# Patient Record
Sex: Female | Born: 1955
Health system: Southern US, Community
[De-identification: ages and names within clinical notes are randomized; demographics above are authoritative.]

## PROBLEM LIST (undated history)

## (undated) DIAGNOSIS — Z8719 Personal history of other diseases of the digestive system: Secondary | ICD-10-CM

## (undated) DIAGNOSIS — J449 Chronic obstructive pulmonary disease, unspecified: Secondary | ICD-10-CM

## (undated) DIAGNOSIS — R0600 Dyspnea, unspecified: Secondary | ICD-10-CM

## (undated) DIAGNOSIS — M797 Fibromyalgia: Secondary | ICD-10-CM

## (undated) DIAGNOSIS — F419 Anxiety disorder, unspecified: Secondary | ICD-10-CM

## (undated) DIAGNOSIS — F32A Depression, unspecified: Secondary | ICD-10-CM

## (undated) DIAGNOSIS — R7303 Prediabetes: Secondary | ICD-10-CM

## (undated) DIAGNOSIS — R112 Nausea with vomiting, unspecified: Secondary | ICD-10-CM

## (undated) DIAGNOSIS — D649 Anemia, unspecified: Secondary | ICD-10-CM

## (undated) DIAGNOSIS — I96 Gangrene, not elsewhere classified: Secondary | ICD-10-CM

## (undated) DIAGNOSIS — R519 Headache, unspecified: Secondary | ICD-10-CM

## (undated) DIAGNOSIS — K219 Gastro-esophageal reflux disease without esophagitis: Secondary | ICD-10-CM

## (undated) DIAGNOSIS — Z9889 Other specified postprocedural states: Secondary | ICD-10-CM

## (undated) DIAGNOSIS — E119 Type 2 diabetes mellitus without complications: Secondary | ICD-10-CM

## (undated) DIAGNOSIS — E039 Hypothyroidism, unspecified: Secondary | ICD-10-CM

## (undated) DIAGNOSIS — M1990 Unspecified osteoarthritis, unspecified site: Secondary | ICD-10-CM

## (undated) DIAGNOSIS — J189 Pneumonia, unspecified organism: Secondary | ICD-10-CM

## (undated) DIAGNOSIS — F329 Major depressive disorder, single episode, unspecified: Secondary | ICD-10-CM

## (undated) DIAGNOSIS — I10 Essential (primary) hypertension: Secondary | ICD-10-CM

## (undated) HISTORY — PX: EYE SURGERY: SHX253

## (undated) HISTORY — PX: BACK SURGERY: SHX140

## (undated) HISTORY — PX: TUBAL LIGATION: SHX77

## (undated) HISTORY — PX: ABDOMINAL HYSTERECTOMY: SHX81

## (undated) HISTORY — PX: DIAGNOSTIC LAPAROSCOPY: SUR761

## (undated) HISTORY — PX: CHOLECYSTECTOMY: SHX55

## (undated) HISTORY — PX: VENTRAL HERNIA REPAIR: SHX424

---

## 2003-02-06 ENCOUNTER — Inpatient Hospital Stay (HOSPITAL_COMMUNITY): Admission: AC | Admit: 2003-02-06 | Discharge: 2003-02-19 | Payer: Self-pay

## 2003-02-07 ENCOUNTER — Encounter: Payer: Self-pay | Admitting: General Surgery

## 2003-02-09 ENCOUNTER — Encounter: Payer: Self-pay | Admitting: General Surgery

## 2003-04-21 ENCOUNTER — Encounter: Admission: RE | Admit: 2003-04-21 | Discharge: 2003-07-20 | Payer: Self-pay | Admitting: General Surgery

## 2003-10-26 ENCOUNTER — Inpatient Hospital Stay (HOSPITAL_COMMUNITY): Admission: RE | Admit: 2003-10-26 | Discharge: 2003-10-30 | Payer: Self-pay | Admitting: General Surgery

## 2003-11-02 ENCOUNTER — Inpatient Hospital Stay (HOSPITAL_COMMUNITY): Admission: RE | Admit: 2003-11-02 | Discharge: 2003-11-04 | Payer: Self-pay | Admitting: General Surgery

## 2010-10-04 DIAGNOSIS — R7301 Impaired fasting glucose: Secondary | ICD-10-CM | POA: Insufficient documentation

## 2011-04-21 NOTE — Discharge Summary (Signed)
NAME:  Chelsea Kerr, Chelsea Kerr                           ACCOUNT NO.:  [id number]   MEDICAL RECORD NO.:  [id number]                   PATIENT TYPE:  INP   LOCATION:  5707                                 FACILITY:  [facility]   PHYSICIAN:  Jimmye Norman III, M.D.               DATE OF BIRTH:  [birth year]   DATE OF ADMISSION:  10/26/2003  DATE OF DISCHARGE:  10/30/2003                                 DISCHARGE SUMMARY   DISCHARGE DIAGNOSIS:  Incisional ventral hernia from traumatic event.   PRINCIPAL PROCEDURE:  Repair of the hernia with mesh.   DISCHARGE MEDICATIONS:  Pain medications including Percocet p.r.n. for pain.   FOLLOW UP:  She was to follow up to see me on November 10, 2003.   BRIEF SUMMARY OF HOSPITAL COURSE:  The patient was admitted the day of  surgery on October 26, 2003 for ventral hernia repair.  The procedure went  as planned, was uneventful.  In the 24 hours prior to discharge she was  afebrile, her vital signs were good.  Her pain was controlled with oral pain  medication.  She had no evidence of infection and was discharged to home.   FOLLOW UP:  Is as previously arranged.                                                Kathrin Ruddy, M.D.    JW/MEDQ  D:  12/09/2003  T:  12/10/2003  Job:  [id number]
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
## 2011-05-31 DIAGNOSIS — L03221 Cellulitis of neck: Secondary | ICD-10-CM | POA: Insufficient documentation

## 2011-11-14 DIAGNOSIS — M5430 Sciatica, unspecified side: Secondary | ICD-10-CM | POA: Insufficient documentation

## 2011-12-04 ENCOUNTER — Other Ambulatory Visit: Payer: Self-pay | Admitting: Orthopedic Surgery

## 2011-12-06 ENCOUNTER — Encounter (HOSPITAL_COMMUNITY)
Admission: RE | Admit: 2011-12-06 | Discharge: 2011-12-06 | Disposition: A | Discharge status: Home / Self Care | Payer: Medicare Other | Source: Ambulatory Visit | Attending: Orthopedic Surgery | Admitting: Orthopedic Surgery

## 2011-12-06 ENCOUNTER — Encounter (HOSPITAL_COMMUNITY): Payer: Self-pay

## 2011-12-06 ENCOUNTER — Ambulatory Visit (HOSPITAL_COMMUNITY)
Admission: RE | Admit: 2011-12-06 | Discharge: 2011-12-06 | Disposition: A | Discharge status: Home / Self Care | Payer: Medicare Other | Source: Ambulatory Visit | Attending: Orthopedic Surgery | Admitting: Orthopedic Surgery

## 2011-12-06 ENCOUNTER — Other Ambulatory Visit: Payer: Self-pay

## 2011-12-06 ENCOUNTER — Encounter (HOSPITAL_COMMUNITY): Payer: Self-pay | Admitting: Respiratory Therapy

## 2011-12-06 HISTORY — DX: Other specified postprocedural states: Z98.890

## 2011-12-06 HISTORY — DX: Anemia, unspecified: D64.9

## 2011-12-06 HISTORY — DX: Hypothyroidism, unspecified: E03.9

## 2011-12-06 HISTORY — DX: Depression, unspecified: F32.A

## 2011-12-06 HISTORY — DX: Personal history of other diseases of the digestive system: Z87.19

## 2011-12-06 HISTORY — DX: Chronic obstructive pulmonary disease, unspecified: J44.9

## 2011-12-06 HISTORY — DX: Unspecified osteoarthritis, unspecified site: M19.90

## 2011-12-06 HISTORY — DX: Gastro-esophageal reflux disease without esophagitis: K21.9

## 2011-12-06 HISTORY — DX: Nausea with vomiting, unspecified: R11.2

## 2011-12-06 HISTORY — DX: Major depressive disorder, single episode, unspecified: F32.9

## 2011-12-06 HISTORY — DX: Anxiety disorder, unspecified: F41.9

## 2011-12-06 LAB — DIFFERENTIAL
Basophils Absolute: 0 10*3/uL (ref 0.0–0.1)
Basophils Relative: 0 % (ref 0–1)
Lymphocytes Relative: 27 % (ref 12–46)
Neutro Abs: 5.9 10*3/uL (ref 1.7–7.7)
Neutrophils Relative %: 66 % (ref 43–77)

## 2011-12-06 LAB — URINE MICROSCOPIC-ADD ON

## 2011-12-06 LAB — COMPREHENSIVE METABOLIC PANEL
BUN: 15 mg/dL (ref 6–23)
CO2: 23 mEq/L (ref 19–32)
Calcium: 10.5 mg/dL (ref 8.4–10.5)
Chloride: 101 mEq/L (ref 96–112)
Creatinine, Ser: 0.83 mg/dL (ref 0.50–1.10)
GFR calc Af Amer: >90 mL/min (ref >90)
GFR calc non Af Amer: 79 mL/min — ABNORMAL LOW (ref >90)
Glucose, Bld: 91 mg/dL (ref 70–99)
Total Bilirubin: 0.2 mg/dL — ABNORMAL LOW (ref 0.3–1.2)

## 2011-12-06 LAB — CBC
HCT: 39.6 % (ref 36.0–46.0)
Hemoglobin: 13.2 g/dL (ref 12.0–15.0)
MCH: 30.6 pg (ref 26.0–34.0)
MCV: 91.9 fL (ref 78.0–100.0)
Platelets: 510 10*3/uL — ABNORMAL HIGH (ref 150–400)
RBC: 4.31 MIL/uL (ref 3.87–5.11)

## 2011-12-06 LAB — URINALYSIS, ROUTINE W REFLEX MICROSCOPIC
Bilirubin Urine: NEGATIVE
Glucose, UA: NEGATIVE mg/dL
Hgb urine dipstick: NEGATIVE
Protein, ur: NEGATIVE mg/dL
Urobilinogen, UA: 0.2 mg/dL (ref 0.0–1.0)

## 2011-12-06 LAB — TYPE AND SCREEN
ABO/RH(D): B POS
Antibody Screen: NEGATIVE

## 2011-12-06 LAB — PROTIME-INR: Prothrombin Time: 13.5 seconds (ref 11.6–15.2)

## 2011-12-06 LAB — ABO/RH: ABO/RH(D): B POS

## 2011-12-06 LAB — SURGICAL PCR SCREEN
MRSA, PCR: NEGATIVE
Staphylococcus aureus: NEGATIVE

## 2011-12-06 MED ORDER — CEFAZOLIN SODIUM-DEXTROSE 2-3 GM-% IV SOLR
2.0000 g | INTRAVENOUS | Status: AC
  Administered 2011-12-07: 2 g via INTRAVENOUS
  Administered 2011-12-07: 1 g via INTRAVENOUS
  Filled 2011-12-06: qty 50

## 2011-12-06 MED ORDER — POVIDONE-IODINE 7.5 % EX SOLN
Freq: Once | CUTANEOUS | Status: DC
  Filled 2011-12-06: qty 118

## 2011-12-06 NOTE — Pre-Procedure Instructions (Addendum)
20 Chelsea Kerr  12/06/2011   Your procedure is scheduled on:  12/07/11  Report to [facility] at800 AM.  Call this number if you have problems the morning of surgery: [phone]   Remember:   Do not eat food:After Midnight.  May have clear liquids: up to 4 Hours before arrival.  Clear liquids include soda, tea, black coffee, apple or grape juice, broth.  Take these medicines the morning of surgery with A SIP OF WATER:welbutrin, gabapentin, thyroid, omeprazole,bispor   Do not wear jewelry, make-up or nail polish.  Do not wear lotions, powders, or perfumes. You may wear deodorant.  Do not shave 48 hours prior to surgery.  Do not bring valuables to the hospital.  Contacts, dentures or bridgework may not be worn into surgery.  Leave suitcase in the car. After surgery it may be brought to your room.  For patients admitted to the hospital, checkout time is 11:00 AM the day of discharge.   Patients discharged the day of surgery will not be allowed to drive home.  Name and phone number of your driver:barbara mcclintock [phone] mom  Special Instructions: Incentive Spirometry - Practice and bring it with you on the day of surgery. and CHG Shower Use Special Wash: 1/2 bottle night before surgery and 1/2 bottle morning of surgery.   Please read over the following fact sheets that you were given: Pain Booklet, Coughing and Deep Breathing, Blood Transfusion Information, MRSA Information and Surgical Site Infection Prevention

## 2011-12-07 ENCOUNTER — Inpatient Hospital Stay (HOSPITAL_COMMUNITY): Payer: Medicare Other | Admitting: Certified Registered"

## 2011-12-07 ENCOUNTER — Encounter (HOSPITAL_COMMUNITY): Payer: Self-pay | Admitting: Certified Registered"

## 2011-12-07 ENCOUNTER — Inpatient Hospital Stay (HOSPITAL_COMMUNITY): Payer: Medicare Other

## 2011-12-07 ENCOUNTER — Encounter (HOSPITAL_COMMUNITY)
Admission: RE | Disposition: A | Discharge status: Home / Self Care | Payer: Self-pay | Source: Ambulatory Visit | Attending: Orthopedic Surgery

## 2011-12-07 ENCOUNTER — Encounter (HOSPITAL_COMMUNITY): Payer: Self-pay | Admitting: *Deleted

## 2011-12-07 ENCOUNTER — Inpatient Hospital Stay (HOSPITAL_COMMUNITY)
Admission: RE | Admit: 2011-12-07 | Discharge: 2011-12-10 | DRG: 460 | Disposition: A | Discharge status: Home / Self Care | Payer: Medicare Other | Source: Ambulatory Visit | Attending: Orthopedic Surgery | Admitting: Orthopedic Surgery

## 2011-12-07 DIAGNOSIS — M5416 Radiculopathy, lumbar region: Secondary | ICD-10-CM

## 2011-12-07 DIAGNOSIS — M5137 Other intervertebral disc degeneration, lumbosacral region: Secondary | ICD-10-CM | POA: Diagnosis present

## 2011-12-07 DIAGNOSIS — F341 Dysthymic disorder: Secondary | ICD-10-CM | POA: Diagnosis present

## 2011-12-07 DIAGNOSIS — J4489 Other specified chronic obstructive pulmonary disease: Secondary | ICD-10-CM | POA: Diagnosis present

## 2011-12-07 DIAGNOSIS — J449 Chronic obstructive pulmonary disease, unspecified: Secondary | ICD-10-CM | POA: Diagnosis present

## 2011-12-07 DIAGNOSIS — E119 Type 2 diabetes mellitus without complications: Secondary | ICD-10-CM | POA: Diagnosis present

## 2011-12-07 DIAGNOSIS — E039 Hypothyroidism, unspecified: Secondary | ICD-10-CM | POA: Diagnosis present

## 2011-12-07 DIAGNOSIS — M51379 Other intervertebral disc degeneration, lumbosacral region without mention of lumbar back pain or lower extremity pain: Secondary | ICD-10-CM | POA: Diagnosis present

## 2011-12-07 DIAGNOSIS — K219 Gastro-esophageal reflux disease without esophagitis: Secondary | ICD-10-CM | POA: Diagnosis present

## 2011-12-07 DIAGNOSIS — M5126 Other intervertebral disc displacement, lumbar region: Principal | ICD-10-CM | POA: Diagnosis present

## 2011-12-07 HISTORY — DX: Radiculopathy, lumbar region: M54.16

## 2011-12-07 SURGERY — POSTERIOR LUMBAR FUSION 1 LEVEL
Anesthesia: General | Site: Back | Laterality: Right | Wound class: Clean

## 2011-12-07 MED ORDER — NALOXONE HCL 0.4 MG/ML IJ SOLN: 0.4000 mg | INTRAMUSCULAR | Status: DC | PRN

## 2011-12-07 MED ORDER — GLYCOPYRROLATE 0.2 MG/ML IJ SOLN
INTRAMUSCULAR | Status: DC | PRN
  Administered 2011-12-07: .6 mg via INTRAVENOUS

## 2011-12-07 MED ORDER — HYDROXYZINE HCL 50 MG/ML IM SOLN
50.0000 mg | INTRAMUSCULAR | Status: DC | PRN
  Filled 2011-12-07: qty 1

## 2011-12-07 MED ORDER — POTASSIUM CHLORIDE IN NACL 20-0.9 MEQ/L-% IV SOLN
INTRAVENOUS | Status: DC
  Administered 2011-12-07: 19:00:00 via INTRAVENOUS
  Filled 2011-12-07 (×3): qty 1000

## 2011-12-07 MED ORDER — ALBUTEROL SULFATE HFA 108 (90 BASE) MCG/ACT IN AERS
2.0000 | INHALATION_SPRAY | Freq: Four times a day (QID) | RESPIRATORY_TRACT | Status: DC | PRN
  Filled 2011-12-07: qty 6.7

## 2011-12-07 MED ORDER — MENTHOL 3 MG MT LOZG: 1.0000 | LOZENGE | OROMUCOSAL | Status: DC | PRN

## 2011-12-07 MED ORDER — DIPHENHYDRAMINE HCL 12.5 MG/5ML PO ELIX
12.5000 mg | ORAL_SOLUTION | Freq: Four times a day (QID) | ORAL | Status: DC | PRN
  Filled 2011-12-07: qty 5

## 2011-12-07 MED ORDER — SODIUM CHLORIDE 0.9 % IV SOLN: 250.0000 mL | INTRAVENOUS | Status: DC

## 2011-12-07 MED ORDER — OXYCODONE-ACETAMINOPHEN 5-325 MG PO TABS
1.0000 | ORAL_TABLET | ORAL | Status: DC | PRN
  Administered 2011-12-08 – 2011-12-09 (×4): 2 via ORAL
  Administered 2011-12-09: 1 via ORAL
  Administered 2011-12-09 – 2011-12-10 (×5): 2 via ORAL
  Filled 2011-12-07 (×11): qty 2

## 2011-12-07 MED ORDER — NEOSTIGMINE METHYLSULFATE 1 MG/ML IJ SOLN
INTRAMUSCULAR | Status: DC | PRN
  Administered 2011-12-07: 4 mg via INTRAVENOUS

## 2011-12-07 MED ORDER — HYDROMORPHONE HCL PF 1 MG/ML IJ SOLN
0.2500 mg | INTRAMUSCULAR | Status: DC | PRN
  Administered 2011-12-07 (×3): 0.5 mg via INTRAVENOUS

## 2011-12-07 MED ORDER — CEFAZOLIN SODIUM 1-5 GM-% IV SOLN
1.0000 g | Freq: Three times a day (TID) | INTRAVENOUS | Status: AC
  Administered 2011-12-07 – 2011-12-08 (×2): 1 g via INTRAVENOUS
  Filled 2011-12-07 (×2): qty 50

## 2011-12-07 MED ORDER — MORPHINE SULFATE (PF) 1 MG/ML IV SOLN
INTRAVENOUS | Status: AC
  Administered 2011-12-07: 15:00:00 via INTRAVENOUS
  Filled 2011-12-07: qty 25

## 2011-12-07 MED ORDER — HYDROMORPHONE HCL PF 1 MG/ML IJ SOLN
INTRAMUSCULAR | Status: AC
  Filled 2011-12-07: qty 1

## 2011-12-07 MED ORDER — MORPHINE SULFATE (PF) 1 MG/ML IV SOLN
INTRAVENOUS | Status: DC
  Administered 2011-12-07: 36 mg via INTRAVENOUS
  Administered 2011-12-08: 15.7 mg via INTRAVENOUS
  Administered 2011-12-08: 36 mg via INTRAVENOUS
  Filled 2011-12-07 (×3): qty 25

## 2011-12-07 MED ORDER — FENTANYL CITRATE 0.05 MG/ML IJ SOLN
INTRAMUSCULAR | Status: DC | PRN
  Administered 2011-12-07 (×3): 50 ug via INTRAVENOUS
  Administered 2011-12-07 (×3): 100 ug via INTRAVENOUS
  Administered 2011-12-07: 50 ug via INTRAVENOUS
  Administered 2011-12-07 (×2): 100 ug via INTRAVENOUS
  Administered 2011-12-07: 50 ug via INTRAVENOUS

## 2011-12-07 MED ORDER — BUPROPION HCL ER (XL) 150 MG PO TB24
150.0000 mg | ORAL_TABLET | Freq: Two times a day (BID) | ORAL | Status: DC
  Administered 2011-12-08 – 2011-12-09 (×5): 150 mg via ORAL
  Filled 2011-12-07 (×8): qty 1

## 2011-12-07 MED ORDER — SODIUM CHLORIDE 0.9 % IJ SOLN: 9.0000 mL | INTRAMUSCULAR | Status: DC | PRN

## 2011-12-07 MED ORDER — LEVOTHYROXINE SODIUM 175 MCG PO TABS
175.0000 ug | ORAL_TABLET | Freq: Every day | ORAL | Status: DC
  Administered 2011-12-08 – 2011-12-09 (×2): 175 ug via ORAL
  Filled 2011-12-07 (×4): qty 1

## 2011-12-07 MED ORDER — MORPHINE SULFATE 4 MG/ML IJ SOLN
2.0000 mg | INTRAMUSCULAR | Status: DC | PRN
  Administered 2011-12-08 – 2011-12-10 (×3): 2 mg via INTRAVENOUS
  Filled 2011-12-07 (×4): qty 1

## 2011-12-07 MED ORDER — ACETAMINOPHEN 650 MG RE SUPP: 650.0000 mg | RECTAL | Status: DC | PRN

## 2011-12-07 MED ORDER — GABAPENTIN 300 MG PO CAPS
300.0000 mg | ORAL_CAPSULE | Freq: Three times a day (TID) | ORAL | Status: DC
  Administered 2011-12-08 – 2011-12-09 (×7): 300 mg via ORAL
  Filled 2011-12-07 (×11): qty 1

## 2011-12-07 MED ORDER — ROCURONIUM BROMIDE 100 MG/10ML IV SOLN
INTRAVENOUS | Status: DC | PRN
  Administered 2011-12-07: 50 mg via INTRAVENOUS

## 2011-12-07 MED ORDER — THERA M PLUS PO TABS
1.0000 | ORAL_TABLET | Freq: Every day | ORAL | Status: DC
  Administered 2011-12-08 – 2011-12-09 (×2): 1 via ORAL
  Filled 2011-12-07 (×4): qty 1

## 2011-12-07 MED ORDER — VECURONIUM BROMIDE 10 MG IV SOLR
INTRAVENOUS | Status: DC | PRN
  Administered 2011-12-07 (×2): 1 mg via INTRAVENOUS

## 2011-12-07 MED ORDER — HYDROXYZINE HCL 50 MG PO TABS
50.0000 mg | ORAL_TABLET | ORAL | Status: DC | PRN
  Filled 2011-12-07: qty 1

## 2011-12-07 MED ORDER — ACETAMINOPHEN 10 MG/ML IV SOLN
1000.0000 mg | Freq: Once | INTRAVENOUS | Status: DC | PRN
Start: 2011-12-07 — End: 2011-12-07

## 2011-12-07 MED ORDER — SODIUM CHLORIDE 0.9 % IV SOLN
INTRAVENOUS | Status: DC | PRN
  Administered 2011-12-07 (×2): via INTRAVENOUS

## 2011-12-07 MED ORDER — SODIUM CHLORIDE 0.9 % IJ SOLN
3.0000 mL | Freq: Two times a day (BID) | INTRAMUSCULAR | Status: DC
  Administered 2011-12-08 – 2011-12-09 (×3): 3 mL via INTRAVENOUS

## 2011-12-07 MED ORDER — ACETAMINOPHEN 325 MG PO TABS: 650.0000 mg | ORAL_TABLET | ORAL | Status: DC | PRN

## 2011-12-07 MED ORDER — ALPRAZOLAM 0.5 MG PO TABS
1.0000 mg | ORAL_TABLET | Freq: Three times a day (TID) | ORAL | Status: DC | PRN
  Administered 2011-12-07 – 2011-12-10 (×8): 1 mg via ORAL
  Filled 2011-12-07 (×8): qty 2

## 2011-12-07 MED ORDER — MIDAZOLAM HCL 5 MG/5ML IJ SOLN
INTRAMUSCULAR | Status: DC | PRN
  Administered 2011-12-07: 2 mg via INTRAVENOUS

## 2011-12-07 MED ORDER — ZOLPIDEM TARTRATE 10 MG PO TABS: 10.0000 mg | ORAL_TABLET | Freq: Every evening | ORAL | Status: DC | PRN

## 2011-12-07 MED ORDER — DIPHENHYDRAMINE HCL 50 MG/ML IJ SOLN
12.5000 mg | Freq: Four times a day (QID) | INTRAMUSCULAR | Status: DC | PRN
  Administered 2011-12-08: 12.5 mg via INTRAVENOUS
  Filled 2011-12-07: qty 1

## 2011-12-07 MED ORDER — PNEUMOCOCCAL VAC POLYVALENT 25 MCG/0.5ML IJ INJ
0.5000 mL | INJECTION | INTRAMUSCULAR | Status: AC
  Administered 2011-12-08: 0.5 mL via INTRAMUSCULAR
  Filled 2011-12-07: qty 0.5

## 2011-12-07 MED ORDER — FLUTICASONE-SALMETEROL 250-50 MCG/DOSE IN AEPB
1.0000 | INHALATION_SPRAY | Freq: Two times a day (BID) | RESPIRATORY_TRACT | Status: DC
  Administered 2011-12-07 – 2011-12-10 (×5): 1 via RESPIRATORY_TRACT
  Filled 2011-12-07: qty 14

## 2011-12-07 MED ORDER — VITAMIN D3 25 MCG (1000 UNIT) PO TABS
1000.0000 [IU] | ORAL_TABLET | Freq: Two times a day (BID) | ORAL | Status: DC
  Administered 2011-12-08 – 2011-12-09 (×5): 1000 [IU] via ORAL
  Filled 2011-12-07 (×8): qty 1

## 2011-12-07 MED ORDER — LACTATED RINGERS IV SOLN
INTRAVENOUS | Status: DC
  Administered 2011-12-07: 09:00:00 via INTRAVENOUS

## 2011-12-07 MED ORDER — CEFAZOLIN SODIUM 1-5 GM-% IV SOLN
INTRAVENOUS | Status: AC
  Filled 2011-12-07: qty 50

## 2011-12-07 MED ORDER — GEMFIBROZIL 600 MG PO TABS
600.0000 mg | ORAL_TABLET | Freq: Two times a day (BID) | ORAL | Status: DC
  Administered 2011-12-08 – 2011-12-09 (×4): 600 mg via ORAL
  Filled 2011-12-07 (×8): qty 1

## 2011-12-07 MED ORDER — SODIUM CHLORIDE 0.9 % IJ SOLN: 3.0000 mL | INTRAMUSCULAR | Status: DC | PRN

## 2011-12-07 MED ORDER — ONDANSETRON HCL 4 MG/2ML IJ SOLN: 4.0000 mg | Freq: Once | INTRAMUSCULAR | Status: DC | PRN

## 2011-12-07 MED ORDER — 0.9 % SODIUM CHLORIDE (POUR BTL) OPTIME
TOPICAL | Status: DC | PRN
  Administered 2011-12-07: 1000 mL

## 2011-12-07 MED ORDER — BUPIVACAINE-EPINEPHRINE 0.25% -1:200000 IJ SOLN
INTRAMUSCULAR | Status: DC | PRN
  Administered 2011-12-07: 6 mL

## 2011-12-07 MED ORDER — PANTOPRAZOLE SODIUM 40 MG PO TBEC
40.0000 mg | DELAYED_RELEASE_TABLET | Freq: Every day | ORAL | Status: DC
  Administered 2011-12-08 – 2011-12-09 (×2): 40 mg via ORAL
  Filled 2011-12-07 (×3): qty 1

## 2011-12-07 MED ORDER — GABAPENTIN 600 MG PO TABS
300.0000 mg | ORAL_TABLET | Freq: Three times a day (TID) | ORAL | Status: DC
  Filled 2011-12-07 (×2): qty 0.5

## 2011-12-07 MED ORDER — DIAZEPAM 5 MG PO TABS
5.0000 mg | ORAL_TABLET | Freq: Four times a day (QID) | ORAL | Status: DC | PRN
  Administered 2011-12-09 – 2011-12-10 (×4): 5 mg via ORAL
  Filled 2011-12-07 (×6): qty 1

## 2011-12-07 MED ORDER — ZOLPIDEM TARTRATE 5 MG PO TABS: 5.0000 mg | ORAL_TABLET | Freq: Every evening | ORAL | Status: DC | PRN

## 2011-12-07 MED ORDER — THROMBIN 20000 UNITS EX KIT
PACK | CUTANEOUS | Status: DC | PRN
  Administered 2011-12-07: 12:00:00 via TOPICAL

## 2011-12-07 MED ORDER — MIDAZOLAM HCL 2 MG/2ML IJ SOLN: 0.5000 mg | Freq: Once | INTRAMUSCULAR | Status: DC | PRN

## 2011-12-07 MED ORDER — LIDOCAINE HCL (CARDIAC) 20 MG/ML IV SOLN
INTRAVENOUS | Status: DC | PRN
  Administered 2011-12-07: 20 mg via INTRAVENOUS

## 2011-12-07 MED ORDER — PROPOFOL 10 MG/ML IV EMUL
INTRAVENOUS | Status: DC | PRN
  Administered 2011-12-07 (×2): 30 mg via INTRAVENOUS
  Administered 2011-12-07: 20 mg via INTRAVENOUS
  Administered 2011-12-07: 30 mg via INTRAVENOUS
  Administered 2011-12-07: 150 mg via INTRAVENOUS

## 2011-12-07 MED ORDER — ONDANSETRON HCL 4 MG/2ML IJ SOLN
4.0000 mg | Freq: Four times a day (QID) | INTRAMUSCULAR | Status: DC | PRN
  Administered 2011-12-07: 4 mg via INTRAVENOUS
  Filled 2011-12-07: qty 2

## 2011-12-07 MED ORDER — BUDESONIDE-FORMOTEROL FUMARATE 160-4.5 MCG/ACT IN AERO
2.0000 | INHALATION_SPRAY | Freq: Two times a day (BID) | RESPIRATORY_TRACT | Status: DC
  Administered 2011-12-07 – 2011-12-10 (×5): 2 via RESPIRATORY_TRACT
  Filled 2011-12-07: qty 6

## 2011-12-07 MED ORDER — PHENOL 1.4 % MT LIQD
1.0000 | OROMUCOSAL | Status: DC | PRN
  Filled 2011-12-07: qty 177

## 2011-12-07 MED ORDER — LACTATED RINGERS IV SOLN
INTRAVENOUS | Status: DC | PRN
  Administered 2011-12-07 (×2): via INTRAVENOUS

## 2011-12-07 SURGICAL SUPPLY — 74 items
APL SKNCLS STERI-STRIP NONHPOA (GAUZE/BANDAGES/DRESSINGS) ×1
BENZOIN TINCTURE PRP APPL 2/3 (GAUZE/BANDAGES/DRESSINGS) ×2 IMPLANT
BLADE SURG ROTATE 9660 (MISCELLANEOUS) ×2 IMPLANT
BUR ROUND PRECISION 4.0 (BURR) ×2 IMPLANT
CAGE CONCORDE BULLET 9X8X27 (Cage) ×2 IMPLANT
CAGE SPNL PRLL BLT NOSE 27X9X8 (Cage) ×1 IMPLANT
CARTRIDGE OIL MAESTRO DRILL (MISCELLANEOUS) ×1 IMPLANT
CLOSURE STERI STRIP 1/2 X4 (GAUZE/BANDAGES/DRESSINGS) ×2 IMPLANT
CLOTH BEACON ORANGE TIMEOUT ST (SAFETY) ×2 IMPLANT
CONT SPEC STER OR (MISCELLANEOUS) ×2 IMPLANT
CORDS BIPOLAR (ELECTRODE) ×2 IMPLANT
COVER SURGICAL LIGHT HANDLE (MISCELLANEOUS) ×2 IMPLANT
DIFFUSER DRILL AIR PNEUMATIC (MISCELLANEOUS) ×2 IMPLANT
DRAIN CHANNEL 15F RND FF W/TCR (WOUND CARE) IMPLANT
DRAPE C-ARM 42X72 X-RAY (DRAPES) ×2 IMPLANT
DRAPE ORTHO SPLIT 77X108 STRL (DRAPES) ×2
DRAPE POUCH INSTRU U-SHP 10X18 (DRAPES) ×2 IMPLANT
DRAPE SURG 17X23 STRL (DRAPES) ×6 IMPLANT
DRAPE SURG ORHT 6 SPLT 77X108 (DRAPES) ×1 IMPLANT
DURAPREP 26ML APPLICATOR (WOUND CARE) ×2 IMPLANT
ELECT BLADE 4.0 EZ CLEAN MEGAD (MISCELLANEOUS) ×2
ELECT CAUTERY BLADE 6.4 (BLADE) ×2 IMPLANT
ELECT REM PT RETURN 9FT ADLT (ELECTROSURGICAL) ×2
ELECTRODE BLDE 4.0 EZ CLN MEGD (MISCELLANEOUS) ×1 IMPLANT
ELECTRODE REM PT RTRN 9FT ADLT (ELECTROSURGICAL) ×1 IMPLANT
EVACUATOR SILICONE 100CC (DRAIN) IMPLANT
GAUZE SPONGE 4X4 16PLY XRAY LF (GAUZE/BANDAGES/DRESSINGS) ×8 IMPLANT
GLOVE BIO SURGEON STRL SZ8 (GLOVE) ×6 IMPLANT
GLOVE BIOGEL PI IND STRL 8 (GLOVE) ×1 IMPLANT
GLOVE BIOGEL PI INDICATOR 8 (GLOVE) ×1
GOWN PREVENTION PLUS XLARGE (GOWN DISPOSABLE) ×2 IMPLANT
GOWN STRL NON-REIN LRG LVL3 (GOWN DISPOSABLE) ×4 IMPLANT
IV CATH 14GX2 1/4 (CATHETERS) ×2 IMPLANT
KIT BASIN OR (CUSTOM PROCEDURE TRAY) ×2 IMPLANT
KIT POSITION SURG JACKSON T1 (MISCELLANEOUS) IMPLANT
KIT ROOM TURNOVER OR (KITS) ×2 IMPLANT
MARKER SKIN DUAL TIP RULER LAB (MISCELLANEOUS) ×2 IMPLANT
NEEDLE BONE MARROW 8GX6 FENEST (NEEDLE) ×2 IMPLANT
NEEDLE HYPO 25GX1X1/2 BEV (NEEDLE) ×2 IMPLANT
NEEDLE SPNL 18GX3.5 QUINCKE PK (NEEDLE) ×4 IMPLANT
NS IRRIG 1000ML POUR BTL (IV SOLUTION) ×8 IMPLANT
OIL CARTRIDGE MAESTRO DRILL (MISCELLANEOUS) ×2
PACK LAMINECTOMY ORTHO (CUSTOM PROCEDURE TRAY) ×2 IMPLANT
PACK UNIVERSAL I (CUSTOM PROCEDURE TRAY) ×2 IMPLANT
PACK VITOSS BIOACTIVE 10CC (Neuro Prosthesis/Implant) ×2 IMPLANT
PAD ARMBOARD 7.5X6 YLW CONV (MISCELLANEOUS) ×4 IMPLANT
PATTIES SURGICAL .5 X1 (DISPOSABLE) ×2 IMPLANT
PATTIES SURGICAL .5X1.5 (GAUZE/BANDAGES/DRESSINGS) IMPLANT
ROD EXPEDIUM PREBENT 5.5X30MM (Rod) ×4 IMPLANT
SCREW EXPEDIUM POLYAXIAL 7X40M (Screw) ×8 IMPLANT
SCREW SET SINGLE INNER (Screw) ×8 IMPLANT
SLEEVE SURGEON STRL (DRAPES) ×2 IMPLANT
SPONGE GAUZE 4X4 12PLY (GAUZE/BANDAGES/DRESSINGS) ×2 IMPLANT
SPONGE GAUZE 4X4 STERILE 39 (GAUZE/BANDAGES/DRESSINGS) ×2 IMPLANT
SPONGE INTESTINAL PEANUT (DISPOSABLE) ×2 IMPLANT
SPONGE SURGIFOAM ABS GEL 100 (HEMOSTASIS) ×2 IMPLANT
STRIP CLOSURE SKIN 1/2X4 (GAUZE/BANDAGES/DRESSINGS) IMPLANT
SURGIFLO TRUKIT (HEMOSTASIS) IMPLANT
SUT MNCRL AB 3-0 PS2 18 (SUTURE) ×2 IMPLANT
SUT MNCRL AB 4-0 PS2 18 (SUTURE) ×2 IMPLANT
SUT VIC AB 0 CT1 18XCR BRD 8 (SUTURE) ×1 IMPLANT
SUT VIC AB 0 CT1 8-18 (SUTURE) ×2
SUT VIC AB 1 CT1 18XCR BRD 8 (SUTURE) ×2 IMPLANT
SUT VIC AB 1 CT1 8-18 (SUTURE) ×2
SUT VIC AB 2-0 CT2 18 VCP726D (SUTURE) ×2 IMPLANT
SYR 20CC LL (SYRINGE) ×2 IMPLANT
SYR BULB IRRIGATION 50ML (SYRINGE) ×2 IMPLANT
SYR CONTROL 10ML LL (SYRINGE) ×4 IMPLANT
TAPE CLOTH SURG 4X10 WHT LF (GAUZE/BANDAGES/DRESSINGS) ×2 IMPLANT
TOWEL OR 17X24 6PK STRL BLUE (TOWEL DISPOSABLE) ×2 IMPLANT
TOWEL OR 17X26 10 PK STRL BLUE (TOWEL DISPOSABLE) ×2 IMPLANT
TRAY FOLEY CATH 14FR (SET/KITS/TRAYS/PACK) ×2 IMPLANT
WATER STERILE IRR 1000ML POUR (IV SOLUTION) IMPLANT
YANKAUER SUCT BULB TIP NO VENT (SUCTIONS) ×2 IMPLANT

## 2011-12-07 NOTE — Preoperative (Signed)
Beta Blockers   Reason not to administer Beta Blockers:Not Applicable 

## 2011-12-07 NOTE — Plan of Care (Signed)
Problem: Consults Goal: Diagnosis - Spinal Surgery Thoraco/Lumbar Spine Fusion     

## 2011-12-07 NOTE — Transfer of Care (Signed)
Immediate Anesthesia Transfer of Care Note  Patient: Chelsea Kerr  Procedure(s) Performed:  POSTERIOR LUMBAR FUSION 1 LEVEL - Right sided transforaminal lumbar interbody fusion, lumbar 4-5 with instrumentation, vitoss, bone marrow aspirate.  Patient Location: PACU  Anesthesia Type: General  Level of Consciousness: oriented, patient cooperative and lethargic  Airway & Oxygen Therapy: Patient Spontanous Breathing and Patient connected to nasal cannula oxygen  Post-op Assessment: Report given to PACU RN  Post vital signs: Reviewed and stable  Complications: No apparent anesthesia complications

## 2011-12-07 NOTE — Anesthesia Preprocedure Evaluation (Signed)
Anesthesia Evaluation  Patient identified by MRN, date of birth, ID band Patient awake    Reviewed: Allergy & Precautions, H&P , NPO status   Airway Mallampati: II TM Distance: >3 FB Neck ROM: Full    Dental  (+) Teeth Intact   Pulmonary  clear to auscultation        Cardiovascular Regular Normal    Neuro/Psych    GI/Hepatic   Endo/Other    Renal/GU      Musculoskeletal   Abdominal (+) obese,  Abdomen: soft.    Peds  Hematology   Anesthesia Other Findings   Reproductive/Obstetrics                           Anesthesia Physical Anesthesia Plan  ASA: III  Anesthesia Plan: General   Post-op Pain Management:    Induction: Intravenous  Airway Management Planned: Oral ETT  Additional Equipment:   Intra-op Plan:   Post-operative Plan: Extubation in OR  Informed Consent: I have reviewed the patients History and Physical, chart, labs and discussed the procedure including the risks, benefits and alternatives for the proposed anesthesia with the patient or authorized representative who has indicated his/her understanding and acceptance.   Dental advisory given  Plan Discussed with: CRNA and Surgeon  Anesthesia Plan Comments: (HNP L4-5 Obesity Asthma/COPD- Lungs clear Anxiety GERD H/O postop N/V  Plan Ga   Kipp Brood, MD)        Anesthesia Quick Evaluation
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
## 2011-12-08 MED ORDER — DIAZEPAM 5 MG PO TABS: 5.0000 mg | ORAL_TABLET | Freq: Four times a day (QID) | ORAL | Status: AC | PRN

## 2011-12-08 MED ORDER — GABAPENTIN 600 MG PO TABS: 300.0000 mg | ORAL_TABLET | Freq: Three times a day (TID) | ORAL | Status: DC

## 2011-12-08 MED ORDER — OXYCODONE HCL 10 MG PO TB12: 10.0000 mg | ORAL_TABLET | Freq: Two times a day (BID) | ORAL | Status: AC

## 2011-12-08 MED ORDER — OXYCODONE HCL 10 MG PO TB12
10.0000 mg | ORAL_TABLET | Freq: Two times a day (BID) | ORAL | Status: DC
  Administered 2011-12-08 – 2011-12-10 (×5): 10 mg via ORAL
  Filled 2011-12-08 (×5): qty 1

## 2011-12-08 NOTE — Anesthesia Postprocedure Evaluation (Signed)
  Anesthesia Post-op Note  Patient: Chelsea Kerr  Procedure(s) Performed:  POSTERIOR LUMBAR FUSION 1 LEVEL - Right sided transforaminal lumbar interbody fusion, lumbar 4-5 with instrumentation, vitoss, bone marrow aspirate.  Patient Location: PACU and Nursing Unit  Anesthesia Type: General  Level of Consciousness: awake, alert  and sedated  Airway and Oxygen Therapy: Patient Spontanous Breathing  Post-op Pain: moderate  Post-op Assessment: Post-op Vital signs reviewed, Patient's Cardiovascular Status Stable, Respiratory Function Stable, Patent Airway and No signs of Nausea or vomiting  Post-op Vital Signs: Reviewed and stable  Complications: No apparent anesthesia complications
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
## 2011-12-08 NOTE — Progress Notes (Signed)
Pt feels well.  Right leg pain improved. Patient anxious, very annoyed by beeping monitors all night.  AVSS NVI Dressing CDI Drain output: 150cc  POD #1 after right L4/5 TLIF  - continue drain until tomorrow - up with PT BID each day - continue Percocet/Valium, D/C PCA, start oxycontin - heplock IV - likely d/c home Saturday vs. Sunday
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
## 2011-12-09 NOTE — Progress Notes (Signed)
Subjective: 2 Days Post-Op Procedure(s) (LRB): POSTERIOR LUMBAR FUSION 1 LEVEL (Right) Patient reports pain as 4 on 0-10 scale.   Slow progress with PT, but improving. Taking po/voiding ok. Objective: Vital signs in last 24 hours: Temp:  [98.1 F (36.7 C)-99.1 F (37.3 C)] 98.3 F (36.8 C) (01/05 1332) Pulse Rate:  [78-97] 97  (01/05 1332) Resp:  [16-20] 17  (01/05 1332) BP: (99-118)/(42-76) 112/53 mmHg (01/05 1332) SpO2:  [89 %-98 %] 89 % (01/05 1332)  Intake/Output from previous day: 01/04 0701 - 01/05 0700 In: 840 [P.O.:840] Out: 31 [Urine:1; Drains:30] Intake/Output this shift:  L-S spine exam: Dressing clean and dry. Drain intact with 30 cc drainage in past 24 hrs. N-V intact to both legs.  Assessment/Plan: 2 Days Post-Op Procedure(s) (LRB): POSTERIOR LUMBAR FUSION 1 LEVEL (Right) Up with therapy Plan for discharge tomorrow Drain pulled. Ceaira Ernster G 12/09/2011, 1:45 PM

## 2011-12-09 NOTE — Progress Notes (Signed)
Physical Therapy Treatment Patient Details Name: Chelsea Kerr MRN: [id number] DOB: August 15, 1956 Today's Date: 12/09/2011  PT Assessment/Plan  PT - Assessment/Plan Comments on Treatment Session: Pt able to complete all mobility at a supervision- modified independence level. Pt is safe to go home with supervision intermittently. PT Plan: Discharge plan remains appropriate PT Frequency: Min 5X/week Follow Up Recommendations: Home health PT;24 hour supervision/assistance Equipment Recommended: None recommended by PT;None recommended by OT PT Goals  Acute Rehab PT Goals PT Goal Formulation: With patient PT Goal: Supine/Side to Sit - Progress: Progressing toward goal PT Goal: Sit to Supine/Side - Progress: Progressing toward goal PT Goal: Sit to Stand - Progress: Progressing toward goal PT Goal: Stand to Sit - Progress: Progressing toward goal PT Transfer Goal: Bed to Chair/Chair to Bed - Progress: Progressing toward goal PT Goal: Ambulate - Progress: Progressing toward goal PT Goal: Perform Home Exercise Program - Progress: Progressing toward goal  PT Treatment Precautions/Restrictions  Precautions Precautions: Back Precaution Booklet Issued: Yes (comment) Precaution Comments: given handouts for precautions, proper body mechanics, available AE. Required Braces or Orthoses: No Restrictions Weight Bearing Restrictions: No Mobility (including Balance) Bed Mobility Bed Mobility: Yes Rolling Left: 5: Supervision;With rail Rolling Left Details (indicate cue type and reason): VC for sequencing Left Sidelying to Sit: 5: Supervision;With rails;HOB flat Left Sidelying to Sit Details (indicate cue type and reason): VC for technique with HOB flat. Pt able to complete without any physical assist Sitting - Scoot to Edge of Bed: 6: Modified independent (Device/Increase time) Transfers Transfers: Yes Sit to Stand: 6: Modified independent (Device/Increase time);From bed;From chair/3-in-1 Stand to  Sit: 6: Modified independent (Device/Increase time);To chair/3-in-1 Ambulation/Gait Ambulation/Gait: Yes Ambulation/Gait Assistance: 5: Supervision Ambulation/Gait Assistance Details (indicate cue type and reason): VC for safety to RW throughout ambulation and transfers Ambulation Distance (Feet): 75 Feet (distance limited by pt) Assistive device: Rolling walker Gait Pattern: Step-to pattern;Decreased stride length;Trunk flexed Gait velocity: Decreased gait speed Stairs: No   End of Session PT - End of Session Equipment Utilized During Treatment: Gait belt Activity Tolerance: Patient limited by pain Patient left: in chair;with call bell in reach Nurse Communication: Mobility status for transfers;Mobility status for ambulation General Behavior During Session: [facility] for tasks performed Cognition: [facility] for tasks performed  Milana Kidney 12/09/2011, 3:31 PM  12/09/2011 Milana Kidney DPT PAGER: [phone] OFFICE: [phone]

## 2011-12-10 MED ORDER — HYDROMORPHONE HCL 2 MG PO TABS: 2.0000 mg | ORAL_TABLET | ORAL | Status: AC | PRN

## 2011-12-10 NOTE — Progress Notes (Signed)
PATIENT ID:      Chelsea Kerr  MRN:     [id number] DOB/AGE:    [birth year] / 55 y.o.    PROGRESS NOTE Subjective: Doing well. Asking for oral dilaudid for pain at discharge. Progressing with PT.   Tolerating Diet: yes         Patient reports pain as 4 on 0-10 scale.    Objective: Vital signs in last 24 hours: Temp:  [97.9 F (36.6 C)-98.4 F (36.9 C)] 98.4 F (36.9 C) (01/06 0555) Pulse Rate:  [85-97] 86  (01/06 0555) Resp:  [16-17] 16  (01/06 0555) BP: (92-125)/(53-65) 125/53 mmHg (01/06 0555) SpO2:  [89 %-98 %] 97 % (01/06 0555)    Intake/Output from previous day: I/O last 3 completed shifts: In: 1470 [P.O.:1470] Out: 5 [Drains:5]         Examination:  Wound: Benign, no sign of infection. Dressing clean and dry. Neurovascular: Intact distally. 5/5 strenth in both legs.  Assessment:    POD #3 s/p Procedure(s) (LRB): POSTERIOR LUMBAR FUSION 1 LEVEL (Right)  Plan: Discharge home. Improved condition. No bending,lifting,or twisting. F/U Dr Yevette Edwards in 10-14-days.     Beata Beason G, PA 12/10/2011, 10:21 AM

## 2011-12-21 NOTE — Discharge Summary (Signed)
NAMEMARYLIN, Chelsea Kerr NO.:  [id number]  MEDICAL RECORD NO.:  [id number]  LOCATION:  5037                         FACILITY:  [facility]  PHYSICIAN:  Estill Bamberg, MD      DATE OF BIRTH:  [birth year]  DATE OF ADMISSION:  12/07/2011 DATE OF DISCHARGE:  12/10/2011                              DISCHARGE SUMMARY   ADMISSION DIAGNOSES: 1. Left-sided L4 radiculopathy. 2. Severe L4-5 degenerative disk disease.  POSTOPERATIVE DIAGNOSES: 1. Left-sided L4 radiculopathy. 2. Severe L4-5 degenerative disk disease.  PROCEDURE PERFORMED:  Right-sided L4-5 transforaminal lumbar interbody fusion.  ADMISSION HISTORY:  Briefly, Ms. Sopher is a very pleasant 56 year old female who presented to me with severe and debilitating pain in her right leg.  I did review an MRI which was notable for severe L4 nerve compression on the right side with severe degenerative disk disease. The patient did fail conservative care and we did therefore have a discussion regarding going forward with operative intervention as noted above.  The patient was admitted on December 07, 2011, for the procedure noted above.  HOSPITAL COURSE:  On December 07, 2011, the patient underwent the procedure noted above.  The patient tolerated the procedure well and was transferred to recovery in stable condition.  The patient was progressively mobilized throughout her hospital stay.  The patient did note improvement in her right leg pain postoperatively.  On the morning of postop #3, it was felt by Orthopedic and Physical Therapy team that the patient would be safe for discharge home.  The patient was uneventfully discharged home on the morning of postop day #3.  DISCHARGE INSTRUCTIONS:  The patient will take Percocet for pain and Valium for spasms.  The patient will adhere to back precautions at all times.  The patient will follow up in my office in approximately 2 weeks after her procedure.     Estill Bamberg,  MD     MD/MEDQ  D:  12/20/2011  T:  12/21/2011  Job:  [id number]

## 2012-11-19 DIAGNOSIS — R059 Cough, unspecified: Secondary | ICD-10-CM | POA: Insufficient documentation

## 2013-06-04 DIAGNOSIS — J029 Acute pharyngitis, unspecified: Secondary | ICD-10-CM | POA: Insufficient documentation

## 2014-04-21 DIAGNOSIS — M51369 Other intervertebral disc degeneration, lumbar region without mention of lumbar back pain or lower extremity pain: Secondary | ICD-10-CM

## 2014-04-21 DIAGNOSIS — M1611 Unilateral primary osteoarthritis, right hip: Secondary | ICD-10-CM | POA: Insufficient documentation

## 2014-04-21 HISTORY — DX: Unilateral primary osteoarthritis, right hip: M16.11

## 2014-04-21 HISTORY — DX: Other intervertebral disc degeneration, lumbar region without mention of lumbar back pain or lower extremity pain: M51.369

## 2014-08-15 DIAGNOSIS — J4540 Moderate persistent asthma, uncomplicated: Secondary | ICD-10-CM | POA: Insufficient documentation

## 2014-08-15 DIAGNOSIS — K219 Gastro-esophageal reflux disease without esophagitis: Secondary | ICD-10-CM | POA: Insufficient documentation

## 2014-08-15 DIAGNOSIS — E039 Hypothyroidism, unspecified: Secondary | ICD-10-CM | POA: Insufficient documentation

## 2014-08-15 DIAGNOSIS — G4700 Insomnia, unspecified: Secondary | ICD-10-CM | POA: Insufficient documentation

## 2014-08-15 DIAGNOSIS — Z79890 Hormone replacement therapy (postmenopausal): Secondary | ICD-10-CM | POA: Insufficient documentation

## 2014-08-15 DIAGNOSIS — K449 Diaphragmatic hernia without obstruction or gangrene: Secondary | ICD-10-CM | POA: Insufficient documentation

## 2014-08-15 DIAGNOSIS — F411 Generalized anxiety disorder: Secondary | ICD-10-CM | POA: Insufficient documentation

## 2014-08-15 DIAGNOSIS — G894 Chronic pain syndrome: Secondary | ICD-10-CM | POA: Insufficient documentation

## 2014-08-15 DIAGNOSIS — E785 Hyperlipidemia, unspecified: Secondary | ICD-10-CM

## 2014-08-15 DIAGNOSIS — M2550 Pain in unspecified joint: Secondary | ICD-10-CM

## 2014-08-15 DIAGNOSIS — F339 Major depressive disorder, recurrent, unspecified: Secondary | ICD-10-CM | POA: Insufficient documentation

## 2014-08-15 DIAGNOSIS — F332 Major depressive disorder, recurrent severe without psychotic features: Secondary | ICD-10-CM | POA: Insufficient documentation

## 2014-08-15 HISTORY — DX: Diaphragmatic hernia without obstruction or gangrene: K44.9

## 2014-08-15 HISTORY — DX: Hyperlipidemia, unspecified: E78.5

## 2014-08-15 HISTORY — DX: Generalized anxiety disorder: F41.1

## 2014-08-15 HISTORY — DX: Hormone replacement therapy: Z79.890

## 2014-08-15 HISTORY — DX: Pain in unspecified joint: M25.50

## 2014-08-15 HISTORY — DX: Major depressive disorder, recurrent severe without psychotic features: F33.2

## 2014-08-15 HISTORY — DX: Chronic pain syndrome: G89.4

## 2014-08-15 HISTORY — DX: Insomnia, unspecified: G47.00

## 2014-08-15 HISTORY — DX: Gastro-esophageal reflux disease without esophagitis: K21.9

## 2014-08-15 HISTORY — DX: Major depressive disorder, recurrent, unspecified: F33.9

## 2014-08-15 HISTORY — DX: Moderate persistent asthma, uncomplicated: J45.40

## 2015-12-17 DIAGNOSIS — F419 Anxiety disorder, unspecified: Secondary | ICD-10-CM | POA: Insufficient documentation

## 2016-07-18 DIAGNOSIS — E559 Vitamin D deficiency, unspecified: Secondary | ICD-10-CM | POA: Insufficient documentation

## 2016-07-18 HISTORY — DX: Vitamin D deficiency, unspecified: E55.9

## 2017-12-13 DIAGNOSIS — Z6827 Body mass index (BMI) 27.0-27.9, adult: Secondary | ICD-10-CM | POA: Diagnosis not present

## 2017-12-13 DIAGNOSIS — F339 Major depressive disorder, recurrent, unspecified: Secondary | ICD-10-CM | POA: Diagnosis not present

## 2017-12-13 DIAGNOSIS — E079 Disorder of thyroid, unspecified: Secondary | ICD-10-CM | POA: Diagnosis not present

## 2017-12-13 DIAGNOSIS — F411 Generalized anxiety disorder: Secondary | ICD-10-CM | POA: Diagnosis not present

## 2017-12-13 DIAGNOSIS — E7800 Pure hypercholesterolemia, unspecified: Secondary | ICD-10-CM | POA: Diagnosis not present

## 2017-12-13 DIAGNOSIS — M25511 Pain in right shoulder: Secondary | ICD-10-CM | POA: Diagnosis not present

## 2017-12-27 DIAGNOSIS — F411 Generalized anxiety disorder: Secondary | ICD-10-CM | POA: Diagnosis not present

## 2017-12-27 DIAGNOSIS — Z13220 Encounter for screening for lipoid disorders: Secondary | ICD-10-CM | POA: Diagnosis not present

## 2017-12-27 DIAGNOSIS — Z1331 Encounter for screening for depression: Secondary | ICD-10-CM | POA: Diagnosis not present

## 2017-12-27 DIAGNOSIS — E079 Disorder of thyroid, unspecified: Secondary | ICD-10-CM | POA: Diagnosis not present

## 2017-12-27 DIAGNOSIS — E039 Hypothyroidism, unspecified: Secondary | ICD-10-CM | POA: Diagnosis not present

## 2017-12-27 DIAGNOSIS — M25511 Pain in right shoulder: Secondary | ICD-10-CM | POA: Diagnosis not present

## 2017-12-27 DIAGNOSIS — F5102 Adjustment insomnia: Secondary | ICD-10-CM | POA: Diagnosis not present

## 2017-12-27 DIAGNOSIS — E7800 Pure hypercholesterolemia, unspecified: Secondary | ICD-10-CM | POA: Diagnosis not present

## 2017-12-27 DIAGNOSIS — F339 Major depressive disorder, recurrent, unspecified: Secondary | ICD-10-CM | POA: Diagnosis not present

## 2017-12-27 DIAGNOSIS — J4541 Moderate persistent asthma with (acute) exacerbation: Secondary | ICD-10-CM | POA: Diagnosis not present

## 2017-12-27 DIAGNOSIS — D649 Anemia, unspecified: Secondary | ICD-10-CM | POA: Diagnosis not present

## 2018-01-11 ENCOUNTER — Ambulatory Visit (HOSPITAL_COMMUNITY): Payer: Self-pay | Admitting: Psychiatry

## 2018-02-01 DIAGNOSIS — M25511 Pain in right shoulder: Secondary | ICD-10-CM | POA: Diagnosis not present

## 2018-02-04 DIAGNOSIS — M25511 Pain in right shoulder: Secondary | ICD-10-CM | POA: Diagnosis not present

## 2018-02-13 DIAGNOSIS — M25511 Pain in right shoulder: Secondary | ICD-10-CM | POA: Diagnosis not present

## 2018-02-25 DIAGNOSIS — Z6837 Body mass index (BMI) 37.0-37.9, adult: Secondary | ICD-10-CM | POA: Diagnosis not present

## 2018-02-25 DIAGNOSIS — Z1211 Encounter for screening for malignant neoplasm of colon: Secondary | ICD-10-CM | POA: Diagnosis not present

## 2018-02-25 DIAGNOSIS — J4541 Moderate persistent asthma with (acute) exacerbation: Secondary | ICD-10-CM | POA: Diagnosis not present

## 2018-02-25 DIAGNOSIS — M25511 Pain in right shoulder: Secondary | ICD-10-CM | POA: Diagnosis not present

## 2018-02-25 DIAGNOSIS — F411 Generalized anxiety disorder: Secondary | ICD-10-CM | POA: Diagnosis not present

## 2018-02-25 DIAGNOSIS — Z1231 Encounter for screening mammogram for malignant neoplasm of breast: Secondary | ICD-10-CM | POA: Diagnosis not present

## 2018-03-06 DIAGNOSIS — Z1231 Encounter for screening mammogram for malignant neoplasm of breast: Secondary | ICD-10-CM | POA: Diagnosis not present

## 2018-03-13 DIAGNOSIS — Z6838 Body mass index (BMI) 38.0-38.9, adult: Secondary | ICD-10-CM | POA: Diagnosis not present

## 2018-03-13 DIAGNOSIS — E7800 Pure hypercholesterolemia, unspecified: Secondary | ICD-10-CM | POA: Diagnosis not present

## 2018-03-13 DIAGNOSIS — L918 Other hypertrophic disorders of the skin: Secondary | ICD-10-CM | POA: Diagnosis not present

## 2018-03-13 DIAGNOSIS — F411 Generalized anxiety disorder: Secondary | ICD-10-CM | POA: Diagnosis not present

## 2018-03-13 DIAGNOSIS — L820 Inflamed seborrheic keratosis: Secondary | ICD-10-CM | POA: Diagnosis not present

## 2018-03-19 DIAGNOSIS — Z1211 Encounter for screening for malignant neoplasm of colon: Secondary | ICD-10-CM | POA: Diagnosis not present

## 2018-03-19 DIAGNOSIS — Z1212 Encounter for screening for malignant neoplasm of rectum: Secondary | ICD-10-CM | POA: Diagnosis not present

## 2018-03-21 DIAGNOSIS — N6001 Solitary cyst of right breast: Secondary | ICD-10-CM | POA: Diagnosis not present

## 2018-03-21 DIAGNOSIS — R928 Other abnormal and inconclusive findings on diagnostic imaging of breast: Secondary | ICD-10-CM | POA: Diagnosis not present

## 2018-04-26 DIAGNOSIS — M62830 Muscle spasm of back: Secondary | ICD-10-CM | POA: Diagnosis not present

## 2018-04-26 DIAGNOSIS — F411 Generalized anxiety disorder: Secondary | ICD-10-CM | POA: Diagnosis not present

## 2018-04-26 DIAGNOSIS — J4541 Moderate persistent asthma with (acute) exacerbation: Secondary | ICD-10-CM | POA: Diagnosis not present

## 2018-04-26 DIAGNOSIS — Z6838 Body mass index (BMI) 38.0-38.9, adult: Secondary | ICD-10-CM | POA: Diagnosis not present

## 2018-06-03 DIAGNOSIS — F332 Major depressive disorder, recurrent severe without psychotic features: Secondary | ICD-10-CM | POA: Diagnosis not present

## 2018-06-03 DIAGNOSIS — F411 Generalized anxiety disorder: Secondary | ICD-10-CM | POA: Diagnosis not present

## 2018-06-05 DIAGNOSIS — F411 Generalized anxiety disorder: Secondary | ICD-10-CM | POA: Diagnosis not present

## 2018-06-05 DIAGNOSIS — I10 Essential (primary) hypertension: Secondary | ICD-10-CM | POA: Diagnosis not present

## 2018-06-05 DIAGNOSIS — Z6838 Body mass index (BMI) 38.0-38.9, adult: Secondary | ICD-10-CM | POA: Diagnosis not present

## 2018-06-05 DIAGNOSIS — E039 Hypothyroidism, unspecified: Secondary | ICD-10-CM | POA: Diagnosis not present

## 2018-06-05 DIAGNOSIS — Z1339 Encounter for screening examination for other mental health and behavioral disorders: Secondary | ICD-10-CM | POA: Diagnosis not present

## 2018-06-05 DIAGNOSIS — F339 Major depressive disorder, recurrent, unspecified: Secondary | ICD-10-CM | POA: Diagnosis not present

## 2018-07-11 DIAGNOSIS — D649 Anemia, unspecified: Secondary | ICD-10-CM | POA: Diagnosis not present

## 2018-07-11 DIAGNOSIS — R7301 Impaired fasting glucose: Secondary | ICD-10-CM | POA: Diagnosis not present

## 2018-07-11 DIAGNOSIS — Z6837 Body mass index (BMI) 37.0-37.9, adult: Secondary | ICD-10-CM | POA: Diagnosis not present

## 2018-07-11 DIAGNOSIS — E7800 Pure hypercholesterolemia, unspecified: Secondary | ICD-10-CM | POA: Diagnosis not present

## 2018-07-11 DIAGNOSIS — G4733 Obstructive sleep apnea (adult) (pediatric): Secondary | ICD-10-CM | POA: Diagnosis not present

## 2018-07-11 DIAGNOSIS — E039 Hypothyroidism, unspecified: Secondary | ICD-10-CM | POA: Diagnosis not present

## 2018-07-11 DIAGNOSIS — I10 Essential (primary) hypertension: Secondary | ICD-10-CM | POA: Diagnosis not present

## 2018-07-22 DIAGNOSIS — F411 Generalized anxiety disorder: Secondary | ICD-10-CM | POA: Diagnosis not present

## 2018-07-22 DIAGNOSIS — F332 Major depressive disorder, recurrent severe without psychotic features: Secondary | ICD-10-CM | POA: Diagnosis not present

## 2018-08-14 DIAGNOSIS — F332 Major depressive disorder, recurrent severe without psychotic features: Secondary | ICD-10-CM | POA: Diagnosis not present

## 2018-08-14 DIAGNOSIS — F411 Generalized anxiety disorder: Secondary | ICD-10-CM | POA: Diagnosis not present

## 2018-09-10 DIAGNOSIS — E7800 Pure hypercholesterolemia, unspecified: Secondary | ICD-10-CM | POA: Diagnosis not present

## 2018-09-10 DIAGNOSIS — I10 Essential (primary) hypertension: Secondary | ICD-10-CM | POA: Diagnosis not present

## 2018-09-10 DIAGNOSIS — F339 Major depressive disorder, recurrent, unspecified: Secondary | ICD-10-CM | POA: Diagnosis not present

## 2018-09-10 DIAGNOSIS — J4541 Moderate persistent asthma with (acute) exacerbation: Secondary | ICD-10-CM | POA: Diagnosis not present

## 2018-09-10 DIAGNOSIS — Z6837 Body mass index (BMI) 37.0-37.9, adult: Secondary | ICD-10-CM | POA: Diagnosis not present

## 2018-10-11 DIAGNOSIS — R1310 Dysphagia, unspecified: Secondary | ICD-10-CM | POA: Diagnosis not present

## 2018-10-11 DIAGNOSIS — Z6838 Body mass index (BMI) 38.0-38.9, adult: Secondary | ICD-10-CM | POA: Diagnosis not present

## 2018-10-11 DIAGNOSIS — J4541 Moderate persistent asthma with (acute) exacerbation: Secondary | ICD-10-CM | POA: Diagnosis not present

## 2018-10-15 ENCOUNTER — Other Ambulatory Visit (HOSPITAL_COMMUNITY): Payer: Self-pay | Admitting: Family Medicine

## 2018-10-15 DIAGNOSIS — R1319 Other dysphagia: Secondary | ICD-10-CM

## 2018-10-15 DIAGNOSIS — R1310 Dysphagia, unspecified: Secondary | ICD-10-CM

## 2018-10-21 ENCOUNTER — Ambulatory Visit (HOSPITAL_COMMUNITY)
Admission: RE | Admit: 2018-10-21 | Discharge: 2018-10-21 | Disposition: A | Discharge status: Home / Self Care | Payer: Medicare HMO | Source: Ambulatory Visit | Attending: Family Medicine | Admitting: Family Medicine

## 2018-10-21 DIAGNOSIS — K449 Diaphragmatic hernia without obstruction or gangrene: Secondary | ICD-10-CM | POA: Insufficient documentation

## 2018-10-21 DIAGNOSIS — R1310 Dysphagia, unspecified: Secondary | ICD-10-CM | POA: Insufficient documentation

## 2018-10-21 DIAGNOSIS — R1319 Other dysphagia: Secondary | ICD-10-CM

## 2018-10-21 DIAGNOSIS — K219 Gastro-esophageal reflux disease without esophagitis: Secondary | ICD-10-CM | POA: Insufficient documentation

## 2018-11-04 DIAGNOSIS — Z0100 Encounter for examination of eyes and vision without abnormal findings: Secondary | ICD-10-CM | POA: Diagnosis not present

## 2018-11-04 DIAGNOSIS — H524 Presbyopia: Secondary | ICD-10-CM | POA: Diagnosis not present

## 2018-12-11 DIAGNOSIS — E1140 Type 2 diabetes mellitus with diabetic neuropathy, unspecified: Secondary | ICD-10-CM | POA: Diagnosis not present

## 2018-12-11 DIAGNOSIS — E039 Hypothyroidism, unspecified: Secondary | ICD-10-CM | POA: Diagnosis not present

## 2018-12-11 DIAGNOSIS — F411 Generalized anxiety disorder: Secondary | ICD-10-CM | POA: Diagnosis not present

## 2018-12-11 DIAGNOSIS — J45909 Unspecified asthma, uncomplicated: Secondary | ICD-10-CM | POA: Diagnosis not present

## 2018-12-11 DIAGNOSIS — Z79899 Other long term (current) drug therapy: Secondary | ICD-10-CM | POA: Diagnosis not present

## 2018-12-11 DIAGNOSIS — F339 Major depressive disorder, recurrent, unspecified: Secondary | ICD-10-CM | POA: Diagnosis not present

## 2018-12-11 DIAGNOSIS — K219 Gastro-esophageal reflux disease without esophagitis: Secondary | ICD-10-CM | POA: Diagnosis not present

## 2018-12-11 DIAGNOSIS — E119 Type 2 diabetes mellitus without complications: Secondary | ICD-10-CM | POA: Diagnosis not present

## 2018-12-11 DIAGNOSIS — I10 Essential (primary) hypertension: Secondary | ICD-10-CM | POA: Diagnosis not present

## 2018-12-11 DIAGNOSIS — E7800 Pure hypercholesterolemia, unspecified: Secondary | ICD-10-CM | POA: Diagnosis not present

## 2018-12-27 ENCOUNTER — Encounter: Payer: Self-pay | Admitting: Gastroenterology

## 2019-01-01 ENCOUNTER — Ambulatory Visit: Payer: Medicare HMO | Admitting: Gastroenterology

## 2019-01-21 ENCOUNTER — Ambulatory Visit: Payer: Medicare HMO | Admitting: Gastroenterology

## 2019-01-28 ENCOUNTER — Encounter

## 2019-01-28 ENCOUNTER — Ambulatory Visit: Payer: Medicare HMO | Admitting: Gastroenterology

## 2019-02-12 DIAGNOSIS — Z6837 Body mass index (BMI) 37.0-37.9, adult: Secondary | ICD-10-CM | POA: Diagnosis not present

## 2019-02-12 DIAGNOSIS — J45901 Unspecified asthma with (acute) exacerbation: Secondary | ICD-10-CM | POA: Diagnosis not present

## 2019-02-24 DIAGNOSIS — M5416 Radiculopathy, lumbar region: Secondary | ICD-10-CM | POA: Diagnosis not present

## 2019-02-25 DIAGNOSIS — M545 Low back pain: Secondary | ICD-10-CM | POA: Diagnosis not present

## 2019-03-04 DIAGNOSIS — M5416 Radiculopathy, lumbar region: Secondary | ICD-10-CM | POA: Diagnosis not present

## 2019-06-16 DIAGNOSIS — J45909 Unspecified asthma, uncomplicated: Secondary | ICD-10-CM | POA: Diagnosis not present

## 2019-06-18 DIAGNOSIS — H35363 Drusen (degenerative) of macula, bilateral: Secondary | ICD-10-CM | POA: Diagnosis not present

## 2019-06-18 DIAGNOSIS — H524 Presbyopia: Secondary | ICD-10-CM | POA: Diagnosis not present

## 2019-06-18 DIAGNOSIS — H04123 Dry eye syndrome of bilateral lacrimal glands: Secondary | ICD-10-CM | POA: Diagnosis not present

## 2019-06-18 DIAGNOSIS — H2513 Age-related nuclear cataract, bilateral: Secondary | ICD-10-CM | POA: Diagnosis not present

## 2019-06-18 DIAGNOSIS — H25013 Cortical age-related cataract, bilateral: Secondary | ICD-10-CM | POA: Diagnosis not present

## 2019-06-25 DIAGNOSIS — K219 Gastro-esophageal reflux disease without esophagitis: Secondary | ICD-10-CM | POA: Diagnosis not present

## 2019-06-25 DIAGNOSIS — E7800 Pure hypercholesterolemia, unspecified: Secondary | ICD-10-CM | POA: Diagnosis not present

## 2019-06-25 DIAGNOSIS — E039 Hypothyroidism, unspecified: Secondary | ICD-10-CM | POA: Diagnosis not present

## 2019-06-25 DIAGNOSIS — F411 Generalized anxiety disorder: Secondary | ICD-10-CM | POA: Diagnosis not present

## 2019-06-25 DIAGNOSIS — F339 Major depressive disorder, recurrent, unspecified: Secondary | ICD-10-CM | POA: Diagnosis not present

## 2019-06-25 DIAGNOSIS — J45909 Unspecified asthma, uncomplicated: Secondary | ICD-10-CM | POA: Diagnosis not present

## 2019-06-25 DIAGNOSIS — I10 Essential (primary) hypertension: Secondary | ICD-10-CM | POA: Diagnosis not present

## 2019-06-25 DIAGNOSIS — E119 Type 2 diabetes mellitus without complications: Secondary | ICD-10-CM | POA: Diagnosis not present

## 2019-06-25 DIAGNOSIS — Z79899 Other long term (current) drug therapy: Secondary | ICD-10-CM | POA: Diagnosis not present

## 2019-07-29 DIAGNOSIS — J019 Acute sinusitis, unspecified: Secondary | ICD-10-CM | POA: Diagnosis not present

## 2019-07-29 DIAGNOSIS — J45901 Unspecified asthma with (acute) exacerbation: Secondary | ICD-10-CM | POA: Diagnosis not present

## 2019-08-17 DIAGNOSIS — J45909 Unspecified asthma, uncomplicated: Secondary | ICD-10-CM | POA: Diagnosis not present

## 2019-09-03 DIAGNOSIS — Z20828 Contact with and (suspected) exposure to other viral communicable diseases: Secondary | ICD-10-CM | POA: Diagnosis not present

## 2019-09-16 DIAGNOSIS — J45909 Unspecified asthma, uncomplicated: Secondary | ICD-10-CM | POA: Diagnosis not present

## 2019-09-25 DIAGNOSIS — E039 Hypothyroidism, unspecified: Secondary | ICD-10-CM | POA: Diagnosis not present

## 2019-09-25 DIAGNOSIS — J45909 Unspecified asthma, uncomplicated: Secondary | ICD-10-CM | POA: Diagnosis not present

## 2019-09-25 DIAGNOSIS — I10 Essential (primary) hypertension: Secondary | ICD-10-CM | POA: Diagnosis not present

## 2019-09-25 DIAGNOSIS — F411 Generalized anxiety disorder: Secondary | ICD-10-CM | POA: Diagnosis not present

## 2019-09-25 DIAGNOSIS — E1140 Type 2 diabetes mellitus with diabetic neuropathy, unspecified: Secondary | ICD-10-CM | POA: Diagnosis not present

## 2019-09-25 DIAGNOSIS — E119 Type 2 diabetes mellitus without complications: Secondary | ICD-10-CM | POA: Diagnosis not present

## 2019-09-25 DIAGNOSIS — E7800 Pure hypercholesterolemia, unspecified: Secondary | ICD-10-CM | POA: Diagnosis not present

## 2019-09-25 DIAGNOSIS — G629 Polyneuropathy, unspecified: Secondary | ICD-10-CM | POA: Diagnosis not present

## 2019-09-25 DIAGNOSIS — F339 Major depressive disorder, recurrent, unspecified: Secondary | ICD-10-CM | POA: Diagnosis not present

## 2019-09-25 DIAGNOSIS — K219 Gastro-esophageal reflux disease without esophagitis: Secondary | ICD-10-CM | POA: Diagnosis not present

## 2019-10-10 DIAGNOSIS — Z6838 Body mass index (BMI) 38.0-38.9, adult: Secondary | ICD-10-CM | POA: Diagnosis not present

## 2019-10-10 DIAGNOSIS — J019 Acute sinusitis, unspecified: Secondary | ICD-10-CM | POA: Diagnosis not present

## 2019-10-10 DIAGNOSIS — J45901 Unspecified asthma with (acute) exacerbation: Secondary | ICD-10-CM | POA: Diagnosis not present

## 2019-10-17 DIAGNOSIS — J45909 Unspecified asthma, uncomplicated: Secondary | ICD-10-CM | POA: Diagnosis not present

## 2019-11-16 DIAGNOSIS — J45909 Unspecified asthma, uncomplicated: Secondary | ICD-10-CM | POA: Diagnosis not present

## 2019-11-19 DIAGNOSIS — J019 Acute sinusitis, unspecified: Secondary | ICD-10-CM | POA: Diagnosis not present

## 2019-11-19 DIAGNOSIS — J45901 Unspecified asthma with (acute) exacerbation: Secondary | ICD-10-CM | POA: Diagnosis not present

## 2019-11-19 DIAGNOSIS — J45909 Unspecified asthma, uncomplicated: Secondary | ICD-10-CM | POA: Diagnosis not present

## 2019-12-17 DIAGNOSIS — J45909 Unspecified asthma, uncomplicated: Secondary | ICD-10-CM | POA: Diagnosis not present

## 2019-12-31 DIAGNOSIS — J45901 Unspecified asthma with (acute) exacerbation: Secondary | ICD-10-CM | POA: Diagnosis not present

## 2019-12-31 DIAGNOSIS — Z20822 Contact with and (suspected) exposure to covid-19: Secondary | ICD-10-CM | POA: Diagnosis not present

## 2019-12-31 DIAGNOSIS — J019 Acute sinusitis, unspecified: Secondary | ICD-10-CM | POA: Diagnosis not present

## 2019-12-31 DIAGNOSIS — G4700 Insomnia, unspecified: Secondary | ICD-10-CM | POA: Diagnosis not present

## 2019-12-31 DIAGNOSIS — F411 Generalized anxiety disorder: Secondary | ICD-10-CM | POA: Diagnosis not present

## 2020-01-17 DIAGNOSIS — J45909 Unspecified asthma, uncomplicated: Secondary | ICD-10-CM | POA: Diagnosis not present

## 2020-02-05 DIAGNOSIS — K219 Gastro-esophageal reflux disease without esophagitis: Secondary | ICD-10-CM | POA: Diagnosis not present

## 2020-02-05 DIAGNOSIS — E039 Hypothyroidism, unspecified: Secondary | ICD-10-CM | POA: Diagnosis not present

## 2020-02-05 DIAGNOSIS — I10 Essential (primary) hypertension: Secondary | ICD-10-CM | POA: Diagnosis not present

## 2020-02-05 DIAGNOSIS — E7800 Pure hypercholesterolemia, unspecified: Secondary | ICD-10-CM | POA: Diagnosis not present

## 2020-02-05 DIAGNOSIS — E119 Type 2 diabetes mellitus without complications: Secondary | ICD-10-CM | POA: Diagnosis not present

## 2020-02-05 DIAGNOSIS — J45909 Unspecified asthma, uncomplicated: Secondary | ICD-10-CM | POA: Diagnosis not present

## 2020-02-05 DIAGNOSIS — F411 Generalized anxiety disorder: Secondary | ICD-10-CM | POA: Diagnosis not present

## 2020-02-05 DIAGNOSIS — F339 Major depressive disorder, recurrent, unspecified: Secondary | ICD-10-CM | POA: Diagnosis not present

## 2020-02-05 DIAGNOSIS — Z79899 Other long term (current) drug therapy: Secondary | ICD-10-CM | POA: Diagnosis not present

## 2020-02-05 DIAGNOSIS — E1140 Type 2 diabetes mellitus with diabetic neuropathy, unspecified: Secondary | ICD-10-CM | POA: Diagnosis not present

## 2020-02-13 ENCOUNTER — Other Ambulatory Visit: Payer: Self-pay | Admitting: Physician Assistant

## 2020-02-13 DIAGNOSIS — Z1231 Encounter for screening mammogram for malignant neoplasm of breast: Secondary | ICD-10-CM

## 2020-02-14 DIAGNOSIS — J45909 Unspecified asthma, uncomplicated: Secondary | ICD-10-CM | POA: Diagnosis not present

## 2020-02-20 DIAGNOSIS — Z6838 Body mass index (BMI) 38.0-38.9, adult: Secondary | ICD-10-CM | POA: Diagnosis not present

## 2020-02-20 DIAGNOSIS — Z20822 Contact with and (suspected) exposure to covid-19: Secondary | ICD-10-CM | POA: Diagnosis not present

## 2020-02-20 DIAGNOSIS — J029 Acute pharyngitis, unspecified: Secondary | ICD-10-CM | POA: Diagnosis not present

## 2020-02-20 DIAGNOSIS — J45901 Unspecified asthma with (acute) exacerbation: Secondary | ICD-10-CM | POA: Diagnosis not present

## 2020-03-03 ENCOUNTER — Ambulatory Visit: Payer: Medicare HMO

## 2020-03-16 DIAGNOSIS — J45909 Unspecified asthma, uncomplicated: Secondary | ICD-10-CM | POA: Diagnosis not present

## 2020-04-09 DIAGNOSIS — J019 Acute sinusitis, unspecified: Secondary | ICD-10-CM | POA: Diagnosis not present

## 2020-04-09 DIAGNOSIS — Z20822 Contact with and (suspected) exposure to covid-19: Secondary | ICD-10-CM | POA: Diagnosis not present

## 2020-04-09 DIAGNOSIS — E119 Type 2 diabetes mellitus without complications: Secondary | ICD-10-CM | POA: Diagnosis not present

## 2020-04-09 DIAGNOSIS — J45901 Unspecified asthma with (acute) exacerbation: Secondary | ICD-10-CM | POA: Diagnosis not present

## 2020-04-15 DIAGNOSIS — J45909 Unspecified asthma, uncomplicated: Secondary | ICD-10-CM | POA: Diagnosis not present

## 2020-05-05 DIAGNOSIS — G629 Polyneuropathy, unspecified: Secondary | ICD-10-CM | POA: Diagnosis not present

## 2020-05-05 DIAGNOSIS — J45909 Unspecified asthma, uncomplicated: Secondary | ICD-10-CM | POA: Diagnosis not present

## 2020-05-05 DIAGNOSIS — F339 Major depressive disorder, recurrent, unspecified: Secondary | ICD-10-CM | POA: Diagnosis not present

## 2020-05-05 DIAGNOSIS — E7800 Pure hypercholesterolemia, unspecified: Secondary | ICD-10-CM | POA: Diagnosis not present

## 2020-05-05 DIAGNOSIS — K219 Gastro-esophageal reflux disease without esophagitis: Secondary | ICD-10-CM | POA: Diagnosis not present

## 2020-05-05 DIAGNOSIS — I10 Essential (primary) hypertension: Secondary | ICD-10-CM | POA: Diagnosis not present

## 2020-05-05 DIAGNOSIS — E039 Hypothyroidism, unspecified: Secondary | ICD-10-CM | POA: Diagnosis not present

## 2020-05-05 DIAGNOSIS — E119 Type 2 diabetes mellitus without complications: Secondary | ICD-10-CM | POA: Diagnosis not present

## 2020-05-05 DIAGNOSIS — F411 Generalized anxiety disorder: Secondary | ICD-10-CM | POA: Diagnosis not present

## 2020-05-16 DIAGNOSIS — J45909 Unspecified asthma, uncomplicated: Secondary | ICD-10-CM | POA: Diagnosis not present

## 2020-05-25 DIAGNOSIS — Z20822 Contact with and (suspected) exposure to covid-19: Secondary | ICD-10-CM | POA: Diagnosis not present

## 2020-05-25 DIAGNOSIS — E119 Type 2 diabetes mellitus without complications: Secondary | ICD-10-CM | POA: Diagnosis not present

## 2020-05-25 DIAGNOSIS — J45901 Unspecified asthma with (acute) exacerbation: Secondary | ICD-10-CM | POA: Diagnosis not present

## 2020-06-21 ENCOUNTER — Emergency Department (HOSPITAL_COMMUNITY): Payer: Medicare HMO

## 2020-06-21 ENCOUNTER — Encounter (HOSPITAL_COMMUNITY): Payer: Self-pay

## 2020-06-21 ENCOUNTER — Telehealth: Payer: Self-pay | Admitting: Nurse Practitioner

## 2020-06-21 ENCOUNTER — Other Ambulatory Visit: Payer: Self-pay

## 2020-06-21 ENCOUNTER — Observation Stay (HOSPITAL_COMMUNITY)
Admission: EM | Admit: 2020-06-21 | Discharge: 2020-06-22 | Disposition: A | Discharge status: Home / Self Care | Payer: Medicare HMO | Attending: Family Medicine | Admitting: Family Medicine

## 2020-06-21 DIAGNOSIS — R0902 Hypoxemia: Secondary | ICD-10-CM | POA: Diagnosis not present

## 2020-06-21 DIAGNOSIS — E119 Type 2 diabetes mellitus without complications: Secondary | ICD-10-CM | POA: Insufficient documentation

## 2020-06-21 DIAGNOSIS — J449 Chronic obstructive pulmonary disease, unspecified: Secondary | ICD-10-CM | POA: Insufficient documentation

## 2020-06-21 DIAGNOSIS — R0602 Shortness of breath: Secondary | ICD-10-CM | POA: Diagnosis not present

## 2020-06-21 DIAGNOSIS — U071 COVID-19: Secondary | ICD-10-CM | POA: Diagnosis not present

## 2020-06-21 DIAGNOSIS — J45909 Unspecified asthma, uncomplicated: Secondary | ICD-10-CM | POA: Diagnosis not present

## 2020-06-21 DIAGNOSIS — Z79899 Other long term (current) drug therapy: Secondary | ICD-10-CM | POA: Diagnosis not present

## 2020-06-21 DIAGNOSIS — E039 Hypothyroidism, unspecified: Secondary | ICD-10-CM | POA: Diagnosis not present

## 2020-06-21 DIAGNOSIS — R509 Fever, unspecified: Secondary | ICD-10-CM | POA: Diagnosis not present

## 2020-06-21 HISTORY — DX: COVID-19: U07.1

## 2020-06-21 LAB — CBC WITH DIFFERENTIAL/PLATELET
Abs Immature Granulocytes: 0.03 10*3/uL (ref 0.00–0.07)
Basophils Absolute: 0 10*3/uL (ref 0.0–0.1)
Basophils Relative: 0 %
Eosinophils Absolute: 0 10*3/uL (ref 0.0–0.5)
Eosinophils Relative: 1 %
HCT: 36.4 % (ref 36.0–46.0)
Hemoglobin: 11.7 g/dL — ABNORMAL LOW (ref 12.0–15.0)
Immature Granulocytes: 1 %
Lymphocytes Relative: 27 %
Lymphs Abs: 1.1 10*3/uL (ref 0.7–4.0)
MCH: 28.9 pg (ref 26.0–34.0)
MCHC: 32.1 g/dL (ref 30.0–36.0)
MCV: 89.9 fL (ref 80.0–100.0)
Monocytes Absolute: 0.4 10*3/uL (ref 0.1–1.0)
Monocytes Relative: 11 %
Neutro Abs: 2.6 10*3/uL (ref 1.7–7.7)
Neutrophils Relative %: 60 %
Platelets: 230 10*3/uL (ref 150–400)
RBC: 4.05 MIL/uL (ref 3.87–5.11)
RDW: 13.5 % (ref 11.5–15.5)
WBC: 4.2 10*3/uL (ref 4.0–10.5)
nRBC: 0 % (ref 0.0–0.2)

## 2020-06-21 LAB — CBC
HCT: 37.7 % (ref 36.0–46.0)
Hemoglobin: 11.9 g/dL — ABNORMAL LOW (ref 12.0–15.0)
MCH: 28.5 pg (ref 26.0–34.0)
MCHC: 31.6 g/dL (ref 30.0–36.0)
MCV: 90.2 fL (ref 80.0–100.0)
Platelets: 242 K/uL (ref 150–400)
RBC: 4.18 MIL/uL (ref 3.87–5.11)
RDW: 13.3 % (ref 11.5–15.5)
WBC: 3.1 K/uL — ABNORMAL LOW (ref 4.0–10.5)
nRBC: 0 % (ref 0.0–0.2)

## 2020-06-21 LAB — COMPREHENSIVE METABOLIC PANEL
ALT: 56 U/L — ABNORMAL HIGH (ref 0–44)
AST: 98 U/L — ABNORMAL HIGH (ref 15–41)
Albumin: 4 g/dL (ref 3.5–5.0)
Alkaline Phosphatase: 64 U/L (ref 38–126)
Anion gap: 12 (ref 5–15)
BUN: 17 mg/dL (ref 8–23)
CO2: 22 mmol/L (ref 22–32)
Calcium: 9.4 mg/dL (ref 8.9–10.3)
Chloride: 99 mmol/L (ref 98–111)
Creatinine, Ser: 1.07 mg/dL — ABNORMAL HIGH (ref 0.44–1.00)
GFR calc Af Amer: >60 mL/min (ref >60)
GFR calc non Af Amer: 55 mL/min — ABNORMAL LOW (ref >60)
Glucose, Bld: 179 mg/dL — ABNORMAL HIGH (ref 70–99)
Potassium: 4.6 mmol/L (ref 3.5–5.1)
Sodium: 133 mmol/L — ABNORMAL LOW (ref 135–145)
Total Bilirubin: 0.4 mg/dL (ref 0.3–1.2)
Total Protein: 7.5 g/dL (ref 6.5–8.1)

## 2020-06-21 LAB — URINALYSIS, ROUTINE W REFLEX MICROSCOPIC
Bilirubin Urine: NEGATIVE
Glucose, UA: NEGATIVE mg/dL
Hgb urine dipstick: NEGATIVE
Ketones, ur: NEGATIVE mg/dL
Leukocytes,Ua: NEGATIVE
Nitrite: NEGATIVE
Protein, ur: NEGATIVE mg/dL
Specific Gravity, Urine: 1.011 (ref 1.005–1.030)
pH: 5 (ref 5.0–8.0)

## 2020-06-21 LAB — FIBRINOGEN: Fibrinogen: 503 mg/dL — ABNORMAL HIGH (ref 210–475)

## 2020-06-21 LAB — LACTIC ACID, PLASMA
Lactic Acid, Venous: 2.3 mmol/L (ref 0.5–1.9)
Lactic Acid, Venous: 2.1 mmol/L (ref 0.5–1.9)

## 2020-06-21 LAB — D-DIMER, QUANTITATIVE: D-Dimer, Quant: 0.36 ug/mL-FEU (ref 0.00–0.50)

## 2020-06-21 LAB — LACTATE DEHYDROGENASE: LDH: 175 U/L (ref 98–192)

## 2020-06-21 LAB — PROCALCITONIN: Procalcitonin: 0.15 ng/mL

## 2020-06-21 LAB — SARS CORONAVIRUS 2 BY RT PCR (HOSPITAL ORDER, PERFORMED IN ~~LOC~~ HOSPITAL LAB): SARS Coronavirus 2: POSITIVE — AB

## 2020-06-21 LAB — CREATININE, SERUM
Creatinine, Ser: 0.94 mg/dL (ref 0.44–1.00)
GFR calc Af Amer: >60 mL/min
GFR calc non Af Amer: >60 mL/min

## 2020-06-21 LAB — FERRITIN: Ferritin: 306 ng/mL (ref 11–307)

## 2020-06-21 LAB — TROPONIN I (HIGH SENSITIVITY)
Troponin I (High Sensitivity): 9 ng/L (ref <18)
Troponin I (High Sensitivity): 9 ng/L (ref <18)

## 2020-06-21 LAB — TRIGLYCERIDES: Triglycerides: 573 mg/dL — ABNORMAL HIGH (ref <150)

## 2020-06-21 LAB — ABO/RH: ABO/RH(D): B POS

## 2020-06-21 LAB — C-REACTIVE PROTEIN: CRP: 2.2 mg/dL — ABNORMAL HIGH (ref <1.0)

## 2020-06-21 MED ORDER — DEXAMETHASONE 4 MG PO TABS
6.0000 mg | ORAL_TABLET | ORAL | Status: DC
  Administered 2020-06-21 – 2020-06-22 (×2): 6 mg via ORAL
  Filled 2020-06-21 (×2): qty 1

## 2020-06-21 MED ORDER — GEMFIBROZIL 600 MG PO TABS
600.0000 mg | ORAL_TABLET | Freq: Two times a day (BID) | ORAL | Status: DC
  Administered 2020-06-22 (×2): 600 mg via ORAL
  Filled 2020-06-21 (×4): qty 1

## 2020-06-21 MED ORDER — SODIUM CHLORIDE 0.9% FLUSH
3.0000 mL | Freq: Two times a day (BID) | INTRAVENOUS | Status: DC
  Administered 2020-06-21 – 2020-06-22 (×2): 3 mL via INTRAVENOUS

## 2020-06-21 MED ORDER — HYDROCOD POLST-CPM POLST ER 10-8 MG/5ML PO SUER
5.0000 mL | Freq: Two times a day (BID) | ORAL | Status: DC | PRN
  Administered 2020-06-22: 5 mL via ORAL
  Filled 2020-06-21: qty 5

## 2020-06-21 MED ORDER — FLUTICASONE FUROATE-VILANTEROL 200-25 MCG/INH IN AEPB
1.0000 | INHALATION_SPRAY | Freq: Every day | RESPIRATORY_TRACT | Status: DC
  Administered 2020-06-22: 1 via RESPIRATORY_TRACT
  Filled 2020-06-21: qty 28

## 2020-06-21 MED ORDER — BISACODYL 5 MG PO TBEC: 5.0000 mg | DELAYED_RELEASE_TABLET | Freq: Every day | ORAL | Status: DC | PRN

## 2020-06-21 MED ORDER — SODIUM CHLORIDE 0.9 % IV SOLN
100.0000 mg | Freq: Every day | INTRAVENOUS | Status: DC
  Administered 2020-06-22: 100 mg via INTRAVENOUS
  Filled 2020-06-21: qty 20

## 2020-06-21 MED ORDER — METHYLPREDNISOLONE SODIUM SUCC 125 MG IJ SOLR
125.0000 mg | Freq: Once | INTRAMUSCULAR | Status: AC
  Administered 2020-06-21: 125 mg via INTRAVENOUS
  Filled 2020-06-21: qty 2

## 2020-06-21 MED ORDER — GABAPENTIN 300 MG PO CAPS
300.0000 mg | ORAL_CAPSULE | Freq: Three times a day (TID) | ORAL | Status: DC
  Administered 2020-06-21 – 2020-06-22 (×4): 300 mg via ORAL
  Filled 2020-06-21 (×3): qty 1

## 2020-06-21 MED ORDER — LEVOTHYROXINE SODIUM 50 MCG PO TABS: 175.0000 ug | ORAL_TABLET | Freq: Every day | ORAL | Status: DC

## 2020-06-21 MED ORDER — ADULT MULTIVITAMIN W/MINERALS CH
1.0000 | ORAL_TABLET | Freq: Every day | ORAL | Status: DC
  Administered 2020-06-21 – 2020-06-22 (×2): 1 via ORAL
  Filled 2020-06-21 (×2): qty 1

## 2020-06-21 MED ORDER — SODIUM CHLORIDE 0.9 % IV SOLN
100.0000 mg | INTRAVENOUS | Status: AC
  Administered 2020-06-21 (×2): 100 mg via INTRAVENOUS
  Filled 2020-06-21 (×2): qty 20

## 2020-06-21 MED ORDER — ZOLPIDEM TARTRATE 10 MG PO TABS: 10.0000 mg | ORAL_TABLET | Freq: Every evening | ORAL | Status: DC | PRN

## 2020-06-21 MED ORDER — IPRATROPIUM BROMIDE HFA 17 MCG/ACT IN AERS
2.0000 | INHALATION_SPRAY | Freq: Once | RESPIRATORY_TRACT | Status: AC
  Administered 2020-06-21: 2 via RESPIRATORY_TRACT
  Filled 2020-06-21: qty 12.9

## 2020-06-21 MED ORDER — ENOXAPARIN SODIUM 40 MG/0.4ML ~~LOC~~ SOLN
40.0000 mg | SUBCUTANEOUS | Status: DC
  Administered 2020-06-21: 40 mg via SUBCUTANEOUS
  Filled 2020-06-21: qty 0.4

## 2020-06-21 MED ORDER — PANTOPRAZOLE SODIUM 40 MG PO TBEC
40.0000 mg | DELAYED_RELEASE_TABLET | Freq: Every day | ORAL | Status: DC
  Administered 2020-06-21 – 2020-06-22 (×2): 40 mg via ORAL
  Filled 2020-06-21 (×2): qty 1

## 2020-06-21 MED ORDER — PANTOPRAZOLE SODIUM 40 MG PO TBEC: 40.0000 mg | DELAYED_RELEASE_TABLET | Freq: Every day | ORAL | Status: DC

## 2020-06-21 MED ORDER — VITAMIN D 25 MCG (1000 UNIT) PO TABS
1000.0000 [IU] | ORAL_TABLET | Freq: Two times a day (BID) | ORAL | Status: DC
  Administered 2020-06-21 – 2020-06-22 (×2): 1000 [IU] via ORAL
  Filled 2020-06-21 (×2): qty 1

## 2020-06-21 MED ORDER — BENZONATATE 100 MG PO CAPS
100.0000 mg | ORAL_CAPSULE | Freq: Three times a day (TID) | ORAL | 0 refills | Status: DC
Start: 2020-06-21 — End: 2020-12-11

## 2020-06-21 MED ORDER — ALPRAZOLAM 1 MG PO TABS
1.0000 mg | ORAL_TABLET | Freq: Three times a day (TID) | ORAL | Status: DC | PRN
  Administered 2020-06-22 (×2): 1 mg via ORAL
  Filled 2020-06-21 (×2): qty 1

## 2020-06-21 MED ORDER — SODIUM CHLORIDE 0.9 % IV BOLUS
500.0000 mL | Freq: Once | INTRAVENOUS | Status: AC
  Administered 2020-06-21: 500 mL via INTRAVENOUS

## 2020-06-21 MED ORDER — SODIUM CHLORIDE 0.9% FLUSH: 3.0000 mL | Freq: Once | INTRAVENOUS | Status: DC

## 2020-06-21 MED ORDER — BUPROPION HCL ER (XL) 150 MG PO TB24
150.0000 mg | ORAL_TABLET | Freq: Two times a day (BID) | ORAL | Status: DC
  Administered 2020-06-21 – 2020-06-22 (×2): 150 mg via ORAL
  Filled 2020-06-21 (×2): qty 1

## 2020-06-21 MED ORDER — DEXAMETHASONE SODIUM PHOSPHATE 10 MG/ML IJ SOLN
10.0000 mg | Freq: Once | INTRAMUSCULAR | Status: AC
  Administered 2020-06-21: 10 mg via INTRAVENOUS
  Filled 2020-06-21: qty 1

## 2020-06-21 MED ORDER — ALBUTEROL SULFATE HFA 108 (90 BASE) MCG/ACT IN AERS
2.0000 | INHALATION_SPRAY | Freq: Once | RESPIRATORY_TRACT | Status: AC
  Administered 2020-06-21: 2 via RESPIRATORY_TRACT
  Filled 2020-06-21: qty 6.7

## 2020-06-21 MED ORDER — FLUTICASONE FUROATE-VILANTEROL 200-25 MCG/INH IN AEPB: 1.0000 | INHALATION_SPRAY | Freq: Every day | RESPIRATORY_TRACT | Status: DC

## 2020-06-21 MED ORDER — ALBUTEROL SULFATE HFA 108 (90 BASE) MCG/ACT IN AERS
2.0000 | INHALATION_SPRAY | RESPIRATORY_TRACT | Status: DC
  Administered 2020-06-21 – 2020-06-22 (×6): 2 via RESPIRATORY_TRACT
  Filled 2020-06-21 (×2): qty 6.7

## 2020-06-21 MED ORDER — ACETAMINOPHEN 325 MG PO TABS
650.0000 mg | ORAL_TABLET | Freq: Once | ORAL | Status: AC | PRN
  Administered 2020-06-21: 650 mg via ORAL
  Filled 2020-06-21: qty 2

## 2020-06-21 NOTE — ED Notes (Signed)
Attempted to call floor regarding patients pending bed assignment, states "we are trying to get everything organized". Will attempt to call back.

## 2020-06-21 NOTE — ED Notes (Signed)
I called patient in the lobby and outside to collect labs and no one responded

## 2020-06-21 NOTE — ED Notes (Signed)
Charge Nurse on [address] states there is no heppa filter in the room for where the patient is assigned. They are moving patients around now to accommodate the patient. Unable to give a time estimate.

## 2020-06-21 NOTE — Discharge Instructions (Addendum)
You have tested POSITIVE for COVID 19 today. Please stay at home and self isolate for 14 days starting today (cleared: 08/03).   I have prescribed cough medication for you - pick up and take as prescribed. Continue using your inhalers and breathing treatments as prescribed.   You are a good candidate for monoclonal antibody infusions - the infusion clinic will call you tomorrow to check on you and see if you are interested.   Monitor your symptoms CLOSELY while at home. Return to the ED IMMEDIATELY for any worsening symptoms including worsening shortness of breath, chest pain, passing out, dizziness, lightheadedness, or any other new/concerning symptoms. If you have a pulse oximeter (or you can buy one through [employer]) you can monitor your oxygen saturation that way - return if the number is persistently below 88%.     You are scheduled for outpatient Remdesivir infusions at 10am on Thursday 7/22, Saturday 7/24, and Monday 7/26 at [facility]. Please park at [place], [place], as staff will be escorting you through the [place] entrance of the hospital.    There is a wave flag banner located near the entrance on [address]. Turn into this entrance and immediately turn left and park in 1 of the 5 designated Covid Infusion Parking spots. There is a phone number on the sign, please call and let the staff know what spot you are in and we will come out and get you. For questions call [phone].  Thanks.

## 2020-06-21 NOTE — ED Notes (Signed)
Pharmacy messaged to verify medications 

## 2020-06-21 NOTE — ED Triage Notes (Addendum)
Patient c/o a productive cough with yellow sputum, fever, SOB, asthma and back pain x 4 days.

## 2020-06-21 NOTE — ED Notes (Signed)
Pt de sat to 88% with ambulation. MD notified
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
## 2020-06-21 NOTE — ED Provider Notes (Addendum)
Accepted handoff at shift change from [facility]. Please see prior provider note for more detail.   Briefly: Patient is 64 y.o.    Plan: Patient is Covid positive asthmatic.  Will follow up on second lactic and ambulatory trial.  If not hypoxic will plan to discharge.  Monoclonal antibody referral discussed with patient already.  Symptomatically improved after antipyretics and small bolus of fluid.  I will follow up on patient and disposition appropriately.     Physical Exam  BP (!) 149/69    Pulse 95    Temp 99.1 F (37.3 C) (Oral)    Resp (!) 24    Ht 5\' 2"  (1.575 m)    Wt 89.8 kg    SpO2 97%    BMI 36.21 kg/m   Physical Exam Vitals and nursing note reviewed.  Constitutional:      General: She is not in acute distress.    Appearance: She is obese.     Comments: Patient has increased work of breathing visible from doorway.  HENT:     Head: Normocephalic and atraumatic.     Nose: Nose normal.     Mouth/Throat:     Mouth: Mucous membranes are moist.  Eyes:     General: No scleral icterus. Cardiovascular:     Rate and Rhythm: Normal rate and regular rhythm.     Pulses: Normal pulses.     Heart sounds: Normal heart sounds.  Pulmonary:     Effort: Pulmonary effort is normal. No respiratory distress.     Breath sounds: No wheezing.     Comments: No wheezing present.  Coarse lung sounds throughout.  Rhonchi.  Increased work of breathing.  Accessory muscle usage.  Respiratory rate is approximately 20. Abdominal:     Palpations: Abdomen is soft.     Tenderness: There is no abdominal tenderness.  Musculoskeletal:     Cervical back: Normal range of motion.     Right lower leg: No edema.     Left lower leg: No edema.  Skin:    General: Skin is warm and dry.     Capillary Refill: Capillary refill takes less than 2 seconds.  Neurological:     Mental Status: She is alert. Mental status is at baseline.  Psychiatric:        Mood and Affect: Mood normal.        Behavior: Behavior  normal.     ED Course/Procedures   Clinical Course as of Jun 21 1516  Mon Jun 21, 2020  1341 SARS Coronavirus 2(!): POSITIVE [MV]    Clinical Course User Index [MV] Eustaquio Maize, PA-C    .Critical Care Performed by: Tedd Sias, PA Authorized by: Tedd Sias, PA   Critical care provider statement:    Critical care time (minutes):  45   Critical care was necessary to treat or prevent imminent or life-threatening deterioration of the following conditions:  Respiratory failure   Critical care was time spent personally by me on the following activities:  Discussions with consultants, evaluation of patient's response to treatment, examination of patient, ordering and performing treatments and interventions, ordering and review of laboratory studies, ordering and review of radiographic studies, pulse oximetry, re-evaluation of patient's condition, obtaining history from patient or surrogate and review of old charts    MDM   Repeat lactate 2.1.  Initial plan was to discharge patient however when evaluated patient she was mildly dyspneic and with ambulation became severely dyspneic and desaturated to 88%.  Will provide patient with Decadron.  I discussed managing physician Dr. Ron Parker to agreed with plan to admit.  Discussed with Dr. Verlon Au will admit patient to hospital for acute respiratory failure secondary to Covid.  Chelsea Kerr was evaluated in Emergency Department on 06/23/2020 for the symptoms described in the history of present illness. She was evaluated in the context of the global COVID-19 pandemic, which necessitated consideration that the patient might be at risk for infection with the SARS-CoV-2 virus that causes COVID-19. Institutional protocols and algorithms that pertain to the evaluation of patients at risk for COVID-19 are in a state of rapid change based on information released by regulatory bodies including the CDC and federal and state organizations. These  policies and algorithms were followed during the patient's care in the ED.    Tedd Sias, [place] 06/23/20 2248    Tedd Sias, [place] 06/23/20 2249    Breck Coons, MD 06/24/20 [phone]
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
## 2020-06-21 NOTE — Telephone Encounter (Signed)
Called to Discuss with patient about Covid symptoms and the use of bamlanivimab, a monoclonal antibody infusion for those with mild to moderate Covid symptoms and at a high risk of hospitalization.     Pt is qualified for this infusion at the [facility] infusion center due to co-morbid conditions and/or a member of an at-risk group.    Symptoms started 06/17/20. Patient would like some time to think about this. I will call her back tomorrow.

## 2020-06-22 ENCOUNTER — Telehealth: Payer: Self-pay | Admitting: Nurse Practitioner

## 2020-06-22 DIAGNOSIS — U071 COVID-19: Secondary | ICD-10-CM | POA: Diagnosis not present

## 2020-06-22 LAB — CBC WITH DIFFERENTIAL/PLATELET
Abs Immature Granulocytes: 0.03 10*3/uL (ref 0.00–0.07)
Basophils Absolute: 0 10*3/uL (ref 0.0–0.1)
Basophils Relative: 0 %
Eosinophils Absolute: 0 10*3/uL (ref 0.0–0.5)
Eosinophils Relative: 0 %
HCT: 35.7 % — ABNORMAL LOW (ref 36.0–46.0)
Hemoglobin: 11.5 g/dL — ABNORMAL LOW (ref 12.0–15.0)
Immature Granulocytes: 1 %
Lymphocytes Relative: 15 %
Lymphs Abs: 0.7 10*3/uL (ref 0.7–4.0)
MCH: 29 pg (ref 26.0–34.0)
MCHC: 32.2 g/dL (ref 30.0–36.0)
MCV: 89.9 fL (ref 80.0–100.0)
Monocytes Absolute: 0.1 10*3/uL (ref 0.1–1.0)
Monocytes Relative: 2 %
Neutro Abs: 3.9 10*3/uL (ref 1.7–7.7)
Neutrophils Relative %: 82 %
Platelets: 233 10*3/uL (ref 150–400)
RBC: 3.97 MIL/uL (ref 3.87–5.11)
RDW: 13.3 % (ref 11.5–15.5)
WBC: 4.7 10*3/uL (ref 4.0–10.5)
nRBC: 0 % (ref 0.0–0.2)

## 2020-06-22 LAB — COMPREHENSIVE METABOLIC PANEL
ALT: 50 U/L — ABNORMAL HIGH (ref 0–44)
AST: 55 U/L — ABNORMAL HIGH (ref 15–41)
Albumin: 3.9 g/dL (ref 3.5–5.0)
Alkaline Phosphatase: 65 U/L (ref 38–126)
Anion gap: 14 (ref 5–15)
BUN: 20 mg/dL (ref 8–23)
CO2: 18 mmol/L — ABNORMAL LOW (ref 22–32)
Calcium: 9.2 mg/dL (ref 8.9–10.3)
Chloride: 101 mmol/L (ref 98–111)
Creatinine, Ser: 1.06 mg/dL — ABNORMAL HIGH (ref 0.44–1.00)
GFR calc Af Amer: >60 mL/min (ref >60)
GFR calc non Af Amer: 56 mL/min — ABNORMAL LOW (ref >60)
Glucose, Bld: 392 mg/dL — ABNORMAL HIGH (ref 70–99)
Potassium: 4.3 mmol/L (ref 3.5–5.1)
Sodium: 133 mmol/L — ABNORMAL LOW (ref 135–145)
Total Bilirubin: 0.3 mg/dL (ref 0.3–1.2)
Total Protein: 7.2 g/dL (ref 6.5–8.1)

## 2020-06-22 LAB — GLUCOSE, CAPILLARY
Glucose-Capillary: 285 mg/dL — ABNORMAL HIGH (ref 70–99)
Glucose-Capillary: 269 mg/dL — ABNORMAL HIGH (ref 70–99)

## 2020-06-22 LAB — D-DIMER, QUANTITATIVE: D-Dimer, Quant: 0.45 ug/mL-FEU (ref 0.00–0.50)

## 2020-06-22 LAB — HIV ANTIBODY (ROUTINE TESTING W REFLEX): HIV Screen 4th Generation wRfx: NONREACTIVE

## 2020-06-22 LAB — C-REACTIVE PROTEIN: CRP: <0.5 mg/dL (ref <1.0)

## 2020-06-22 MED ORDER — GEMFIBROZIL 600 MG PO TABS: 600.0000 mg | ORAL_TABLET | Freq: Two times a day (BID) | ORAL | Status: DC

## 2020-06-22 MED ORDER — BUPROPION HCL ER (XL) 150 MG PO TB24: 150.0000 mg | ORAL_TABLET | Freq: Two times a day (BID) | ORAL | Status: DC

## 2020-06-22 MED ORDER — ACETAMINOPHEN 500 MG PO TABS
500.0000 mg | ORAL_TABLET | Freq: Once | ORAL | Status: AC
  Administered 2020-06-22: 500 mg via ORAL
  Filled 2020-06-22: qty 1

## 2020-06-22 MED ORDER — BUDESONIDE-FORMOTEROL FUMARATE 160-4.5 MCG/ACT IN AERO: 2.0000 | INHALATION_SPRAY | Freq: Two times a day (BID) | RESPIRATORY_TRACT | Status: DC

## 2020-06-22 MED ORDER — VITAMIN D 1000 UNITS PO TABS: 1000.0000 [IU] | ORAL_TABLET | Freq: Two times a day (BID) | ORAL | Status: DC

## 2020-06-22 MED ORDER — GLIMEPIRIDE 2 MG PO TABS: 2.0000 mg | ORAL_TABLET | Freq: Every day | ORAL | 0 refills | Status: DC

## 2020-06-22 MED ORDER — PNEUMOCOCCAL VAC POLYVALENT 25 MCG/0.5ML IJ INJ: 0.5000 mL | INJECTION | INTRAMUSCULAR | Status: DC

## 2020-06-22 MED ORDER — GLIMEPIRIDE 2 MG PO TABS
2.0000 mg | ORAL_TABLET | Freq: Every day | ORAL | Status: DC
  Administered 2020-06-22: 2 mg via ORAL
  Filled 2020-06-22: qty 1

## 2020-06-22 MED ORDER — LEVOTHYROXINE SODIUM 50 MCG PO TABS
175.0000 ug | ORAL_TABLET | Freq: Every day | ORAL | Status: DC
  Administered 2020-06-22: 175 ug via ORAL
  Filled 2020-06-22: qty 1

## 2020-06-22 MED ORDER — HYDROCOD POLST-CPM POLST ER 10-8 MG/5ML PO SUER: 5.0000 mL | Freq: Two times a day (BID) | ORAL | 0 refills | Status: DC | PRN

## 2020-06-22 MED ORDER — DEXAMETHASONE 6 MG PO TABS: 6.0000 mg | ORAL_TABLET | ORAL | 0 refills | Status: AC

## 2020-06-22 MED ORDER — INSULIN ASPART 100 UNIT/ML ~~LOC~~ SOLN
5.0000 [IU] | Freq: Once | SUBCUTANEOUS | Status: AC
  Administered 2020-06-22: 5 [IU] via SUBCUTANEOUS

## 2020-06-22 NOTE — Progress Notes (Addendum)
CSW received a call from [place] (or pt's RN at [phone]) who states pt is not wanting to leave due to her being under the impression that Medicare won't pay unless pt has another day's stay in the hospital and pt's CN is requesting that pt be given clarification.  Per CN, pt is ready for D./C.  CN states she called and left a message with the RN CM but has not hear back yet.  CSW will insure the RN CM is aware.  5:28 PM RN CM updated and will call pt now.  CN updated.  CSW will continue to follow for D/C needs.  Alphonse Guild. Davieon Stockham  MSW, LCSW, LCAS, CCS Transitions of Care Clinical Social Worker Care Coordination Department Ph: [phone]

## 2020-06-22 NOTE — Discharge Summary (Signed)
Physician Discharge Summary  SHAKEEMA LIPPMAN MCN:[id number] DOB: 04/12/1956 DOA: 06/21/2020  PCP: Isaias Sakai, DO  Admit date: 06/21/2020 Discharge date: 06/22/2020  Time spent: 40 minutes  Recommendations for Outpatient Follow-up:  1. Requires blood sugar checks twice daily prebreakfast presupper for the next month 2. Started on Amaryl 2 mg on discharge 3. Continue Decadron and complete 4. We will schedule for outpatient infusions of remdesivir 5. Needs labs in about 1 to 2 weeks in the outpatient setting 6. PCP to please check and test her for OHSS with bedside tools such as Ep[place] or stop bang in the outpatient setting  Discharge Diagnoses:  Active Problems:   COVID-19   Discharge Condition: Improved  Diet recommendation: Diabetic  Filed Weights   06/21/20 1100  Weight: 89.8 kg    History of present illness:  22 white female Prior lumbar radiculopathy status post R L4/5 TLIF Dr. Lynann Bologna 2013 on gabapentin Stable asthma on inhalers followed by her primary care physician never intubated and has had asthma since childhood  Depression Reflux BMI 36 Tells me he has had 6 months of feeling poorly -Sees a physician in [place] been to see him "at least 10 times" in the past 6 months and has been tested about that many times for Covid and every test has been negative Went to see him last week-tested negative again-was given different medications for her asthma and told to trial done Became symptomatic 06/18/2019 1S OB, mild low-grade fever progressive worsening DOE until 06/20/2020 severe shortness of breath She was satting fairly well at rest in the emergency room but dropped her sats to 88 percentile and was admitted for the same  Hospital Course:  Coronavirus 19 infection  Start Covid pathway remdesivir to be given and placed on Decadron Will use mainly inhalers for her underlying lung disease We will set up for remdesivir infusions in the outpatient  setting and longer course of steroids given her underlying lung disease Hyperglycemia without diagnosis of diabetes mellitus Secondary to steroids Patient is capable of sticking herself and getting blood sugars and monitoring in the outpatient setting with glucometer as evidenced by teaching by nursing staff today on discharge I will start Amaryl 2 mg on discharge and she will need to keep a diary and follow-up in 1 month with her PCP Lifelong asthma Resume Symbicort twice daily can use albuterol inhalers and increase to every 4 hourly if able to tolerate would hold Advair for now Steroids should help with some of her symptoms in addition Probable sleep apnea Patient has an exercise >size 15 and she has a BMI above 36 She should have outpatient pulmonology testing for the same Prior lumbar disc surgery Resume gabapentin 300 3 times daily Early mobilization Hypothyroidism Resume Synthroid 175 daily Anxiety Resuming Wellbutrin 150 twice daily, Xanax 3 times daily 1 mg   Discharge Exam: Vitals:   06/22/20 0100 06/22/20 0400  BP:  (!) 159/75  Pulse: 90 83  Resp: 20 14  Temp:  97.6 F (36.4 C)  SpO2: 96% 95%    General: Awake coherent no distress chest clear Cardiovascular: S1-S2 no murmur rub gallop Respiratory: Clear no added sound Abdomen soft nontender no rebound no guarding Neurologically intact  Discharge Instructions   Discharge Instructions    Diet - low sodium heart healthy   Complete by: As directed    Discharge instructions   Complete by: As directed    You will need to check your sugars carefully and I will  call in a prescription for glucometer to ensure that you have a way to check your sugars and you will need to follow-up with your primary care physician in about a week or so and ask him what to do about your blood sugars if you can record them I will check twice daily before meals and use the Amaryl as a tablet in the morning I expect that your sugars once  they go down once you are off of the total dose of steroids you will not need to use the Amaryl but speak with your physician about this and make sure you get labs in about 1 week to 2 weeks in the outpatient setting We will set you up for infusions of remdesivir as this is proven to be a reasonable drug to use for the COVID-19 remember that because you were admitted to the hospital you are considered high risk and you will require self-isolation/quarantine from others from 21 days from the day of your diagnosis Please ensure that you wear a mask whenever you are outdoors and try to stay self quarantine until you are infectivity time is over   Increase activity slowly   Complete by: As directed    Temperature monitoring   Complete by: Jun 22, 2020    After how many days would you like to receive a notification of this patient's flowsheet entries?: 1   MyChart COVID-19 home monitoring program   Complete by: Jun 23, 2020    Is the patient willing to use the [facility] for home monitoring?: Yes     Allergies as of 06/22/2020   No Known Allergies     Medication List    STOP taking these medications   aspirin EC 81 MG tablet   lisinopril-hydrochlorothiazide 20-25 MG tablet Commonly known as: ZESTORETIC   meloxicam 15 MG tablet Commonly known as: MOBIC   zafirlukast 20 MG tablet Commonly known as: ACCOLATE     TAKE these medications   albuterol 108 (90 Base) MCG/ACT inhaler Commonly known as: VENTOLIN HFA Inhale 2 puffs into the lungs every 4 (four) hours as needed for wheezing or shortness of breath. For shortness of breath   ALPRAZolam 1 MG tablet Commonly known as: XANAX Take 1 mg by mouth 3 (three) times daily as needed for anxiety.   benzonatate 100 MG capsule Commonly known as: TESSALON Take 1 capsule (100 mg total) by mouth every 8 (eight) hours.   budesonide-formoterol 160-4.5 MCG/ACT inhaler Commonly known as: SYMBICORT Inhale 2 puffs into the lungs 2 (two)  times daily.   buPROPion 300 MG 24 hr tablet Commonly known as: WELLBUTRIN XL Take 300 mg by mouth daily.   buPROPion 150 MG 24 hr tablet Commonly known as: WELLBUTRIN XL Take 1 tablet (150 mg total) by mouth 2 (two) times daily.   busPIRone 10 MG tablet Commonly known as: BUSPAR Take 10 mg by mouth 3 (three) times daily.   chlorpheniramine-HYDROcodone 10-8 MG/5ML Suer Commonly known as: TUSSIONEX Take 5 mLs by mouth every 12 (twelve) hours as needed for cough.   cholecalciferol 1000 units tablet Commonly known as: VITAMIN D Take 1 tablet (1,000 Units total) by mouth 2 (two) times daily.   cyclobenzaprine 10 MG tablet Commonly known as: FLEXERIL Take 10 mg by mouth 3 (three) times daily as needed for muscle spasms.   dexamethasone 6 MG tablet Commonly known as: DECADRON Take 1 tablet (6 mg total) by mouth daily for 8 days. Start taking on: June 23, 2020   estradiol 1 MG tablet Commonly known as: ESTRACE Take 1 mg by mouth daily.   Fluticasone-Salmeterol 500-50 MCG/DOSE Aepb Commonly known as: ADVAIR Inhale 1 puff into the lungs 2 (two) times daily.   gabapentin 600 MG tablet Commonly known as: NEURONTIN Take 0.5 tablets (300 mg total) by mouth 3 (three) times daily. What changed: how much to take   gemfibrozil 600 MG tablet Commonly known as: LOPID Take 1 tablet (600 mg total) by mouth 2 (two) times daily before a meal.   glimepiride 2 MG tablet Commonly known as: AMARYL Take 1 tablet (2 mg total) by mouth daily with breakfast. Start taking on: June 23, 2020   levothyroxine 200 MCG tablet Commonly known as: SYNTHROID Take 200 mcg by mouth daily before breakfast.   pantoprazole 40 MG tablet Commonly known as: PROTONIX Take 40 mg by mouth 2 (two) times daily.   rosuvastatin 10 MG tablet Commonly known as: CRESTOR Take 10 mg by mouth at bedtime.   zolpidem 10 MG tablet Commonly known as: AMBIEN Take 10 mg by mouth at bedtime as needed. For sleep             Durable Medical Equipment  (From admission, onward)         Start     Ordered   06/22/20 1443  DME Glucometer  Once       Comments: Will need a glucometer with strips and pen needles to take blood sugars premeal for the next month Would give supplies for at least 1 month   06/22/20 1443         No Known Allergies  Follow-up Information    Isaias Sakai, DO.   Specialties: Family Medicine, Obstetrics and Gynecology Contact information: [place]  [id number] [phone]                The results of significant diagnostics from this hospitalization (including imaging, microbiology, ancillary and laboratory) are listed below for reference.    Significant Diagnostic Studies: DG Chest Port 1 View  Result Date: 06/21/2020 CLINICAL DATA:  Shortness of breath, fever EXAM: PORTABLE CHEST 1 VIEW COMPARISON:  12/06/2011 FINDINGS: Cardiac silhouette is upper limits of normal in size. Streaky right basilar interstitial opacity. No appreciable pleural fluid collection. No pneumothorax. Suspect right sided rotator cuff calcific tendinopathy. IMPRESSION: Streaky right basilar interstitial opacity, atelectasis versus developing infiltrate. Electronically Signed   By: Davina Poke D.O.   On: 06/21/2020 12:23    Microbiology: Recent Results (from the past 240 hour(s))  SARS Coronavirus 2 by RT PCR (hospital order, performed in [facility] hospital lab) Nasopharyngeal Nasopharyngeal Swab     Status: Abnormal   Collection Time: 06/21/20 12:17 PM   Specimen: Nasopharyngeal Swab  Result Value Ref Range Status   SARS Coronavirus 2 POSITIVE (A) NEGATIVE Final    Comment: RESULT CALLED TO, READ BACK BY AND VERIFIED WITH: BALDWIN,L. RN AT 4709 06/21/20 MULLINS,T (NOTE) SARS-CoV-2 target nucleic acids are DETECTED  SARS-CoV-2 RNA is generally detectable in upper respiratory specimens  during the acute phase of infection.  Positive results are  indicative  of the presence of the identified virus, but do not rule out bacterial infection or co-infection with other pathogens not detected by the test.  Clinical correlation with patient history and  other diagnostic information is necessary to determine patient infection status.  The expected result is negative.  Fact Sheet for Patients:   StrictlyIdeas.no   Fact Sheet for  Healthcare Providers:   BankingDealers.co.za    This test is not yet approved or cleared by the [place] and  has been authorized for detection and/or diagnosis of SARS-CoV-2 by FDA under an Emergency Use Authorization (EUA).  This EUA will remain in effect (meaning t his test can be used) for the duration of  the COVID-19 declaration under Section 564(b)(1) of the Act, 21 U.S.C. section 360-bbb-3(b)(1), unless the authorization is terminated or revoked sooner.  Performed at [facility], [place] [address], [place], [place] [address]      Labs: Basic Metabolic Panel: Recent Labs  Lab 06/21/20 1215 06/21/20 1837 06/22/20 0424  NA 133*  --  133*  K 4.6  --  4.3  CL 99  --  101  CO2 22  --  18*  GLUCOSE 179*  --  392*  BUN 17  --  20  CREATININE 1.07* 0.94 1.06*  CALCIUM 9.4  --  9.2   Liver Function Tests: Recent Labs  Lab 06/21/20 1215 06/22/20 0424  AST 98* 55*  ALT 56* 50*  ALKPHOS 64 65  BILITOT 0.4 0.3  PROT 7.5 7.2  ALBUMIN 4.0 3.9   No results for input(s): LIPASE, AMYLASE in the last 168 hours. No results for input(s): AMMONIA in the last 168 hours. CBC: Recent Labs  Lab 06/21/20 1215 06/21/20 1837 06/22/20 0424  WBC 4.2 3.1* 4.7  NEUTROABS 2.6  --  3.9  HGB 11.7* 11.9* 11.5*  HCT 36.4 37.7 35.7*  MCV 89.9 90.2 89.9  PLT 230 242 233   Cardiac Enzymes: No results for input(s): CKTOTAL, CKMB, CKMBINDEX, TROPONINI in the last 168 hours. BNP: BNP (last 3 results) No results for input(s): BNP in the  last 8760 hours.  ProBNP (last 3 results) No results for input(s): PROBNP in the last 8760 hours.  CBG: Recent Labs  Lab 06/22/20 0910 06/22/20 1333  GLUCAP 285* 269*       Signed:  Nita Sells MD   [facility] 06/22/2020, 2:43 PM

## 2020-06-22 NOTE — Care Management Obs Status (Signed)
[facility] NOTIFICATION   Patient Details  Name: Chelsea Kerr MRN: [id number] Date of Birth: 08-29-1956   Medicare Observation Status Notification Given:  Yes    Purcell Mouton, RN 06/22/2020, 4:35 PM

## 2020-06-22 NOTE — Telephone Encounter (Signed)
Called to Discuss with patient about Covid symptoms and the use of bamlanivimab, a monoclonal antibody infusion for those with mild to moderate Covid symptoms and at a high risk of hospitalization.     Pt is qualified for this infusion at the [facility] infusion center due to co-morbid conditions and/or a member of an at-risk group.     Unable to reach pt  

## 2020-06-22 NOTE — Progress Notes (Signed)
   06/22/20 0029  Vitals  Temp 98.4 F (36.9 C)  Temp Source Oral  BP (!) 176/81  MAP (mmHg) 107  BP Location Left Arm  BP Method Automatic  Patient Position (if appropriate) Sitting  Pulse Rate 99  Resp 18  MEWS COLOR  MEWS Score Color Green  Oxygen Therapy  SpO2 96 %  O2 Device Room Air  MEWS Score  MEWS Temp 0  MEWS Systolic 0  MEWS Pulse 0  MEWS RR 0  MEWS LOC 0  MEWS Score 0     06/22/20 0029  Vitals  Temp 98.4 F (36.9 C)  Temp Source Oral  BP (!) 176/81  MAP (mmHg) 107  BP Location Left Arm  BP Method Automatic  Patient Position (if appropriate) Sitting  Pulse Rate 99  Resp 18  MEWS COLOR  MEWS Score Color Green  Oxygen Therapy  SpO2 96 %  O2 Device Room Air  MEWS Score  MEWS Temp 0  MEWS Systolic 0  MEWS Pulse 0  MEWS RR 0  MEWS LOC 0  MEWS Score 0  The patient is admitted to [address] 1440 with the diagnosis of Covid19. A & O x 4. Denied any acute pain at this time. No respiratory distress noted. R/A sating 98 to 100%.  Requested for Xanax 1 mg oral as needed for anxiety, which was effective.  Full assessment to epic completed. Will continue to monitor.

## 2020-06-22 NOTE — Progress Notes (Signed)
Patient scheduled for outpatient Remdesivir infusion at 10am on Thursday 7/22, Saturday 7/24, and Monday 7/26 at [facility]. Please inform the patient to park at [place], as staff will be escorting the patient through the [place] entrance of the hospital.    There is a wave flag banner located near the entrance on [address]. Turn into this entrance and immediately turn left and park in 1 of the 5 designated Covid Infusion Parking spots. There is a phone number on the sign, please call and let the staff know what spot you are in and we will come out and get you. For questions call [phone].  Thanks.

## 2020-06-22 NOTE — Progress Notes (Signed)
TOC CM received call to speak to pt about observation status and billing. Explained observation notice. Pt verbalized understanding. Instructed her to contact [employer] to discuss her cost for outpatient stay. [place], [place] [facility] TOC CM [phone]

## 2020-06-24 ENCOUNTER — Ambulatory Visit (HOSPITAL_COMMUNITY): Payer: Medicare HMO

## 2020-06-26 ENCOUNTER — Other Ambulatory Visit (HOSPITAL_COMMUNITY): Payer: Self-pay

## 2020-06-26 ENCOUNTER — Ambulatory Visit (HOSPITAL_COMMUNITY)
Admit: 2020-06-26 | Discharge: 2020-06-26 | Disposition: A | Discharge status: Home / Self Care | Payer: Medicare HMO | Attending: Pulmonary Disease | Admitting: Pulmonary Disease

## 2020-06-26 DIAGNOSIS — U071 COVID-19: Secondary | ICD-10-CM | POA: Insufficient documentation

## 2020-06-26 DIAGNOSIS — J1282 Pneumonia due to coronavirus disease 2019: Secondary | ICD-10-CM | POA: Insufficient documentation

## 2020-06-26 MED ORDER — DIPHENHYDRAMINE HCL 50 MG/ML IJ SOLN: 50.0000 mg | Freq: Once | INTRAMUSCULAR | Status: DC | PRN

## 2020-06-26 MED ORDER — EPINEPHRINE 0.3 MG/0.3ML IJ SOAJ: 0.3000 mg | Freq: Once | INTRAMUSCULAR | Status: DC | PRN

## 2020-06-26 MED ORDER — SODIUM CHLORIDE 0.9 % IV SOLN: INTRAVENOUS | Status: DC | PRN

## 2020-06-26 MED ORDER — ALBUTEROL SULFATE HFA 108 (90 BASE) MCG/ACT IN AERS: 2.0000 | INHALATION_SPRAY | Freq: Once | RESPIRATORY_TRACT | Status: DC | PRN

## 2020-06-26 MED ORDER — FAMOTIDINE IN NACL 20-0.9 MG/50ML-% IV SOLN: 20.0000 mg | Freq: Once | INTRAVENOUS | Status: DC | PRN

## 2020-06-26 MED ORDER — METHYLPREDNISOLONE SODIUM SUCC 125 MG IJ SOLR: 125.0000 mg | Freq: Once | INTRAMUSCULAR | Status: DC | PRN

## 2020-06-26 MED ORDER — SODIUM CHLORIDE 0.9 % IV SOLN
100.0000 mg | Freq: Once | INTRAVENOUS | Status: AC
  Administered 2020-06-26: 100 mg via INTRAVENOUS
  Filled 2020-06-26: qty 20

## 2020-06-26 NOTE — Progress Notes (Signed)
  Diagnosis: COVID-19  Physician:Dr Joya Gaskins  Procedure: Covid Infusion Clinic Med: remdesivir infusion - Provided patient with remdesivir fact sheet for patients, parents and caregivers prior to infusion.  Complications: No immediate complications noted.  Discharge: Discharged home   Chelsea Kerr 06/26/2020

## 2020-06-26 NOTE — Discharge Instructions (Signed)
10 Things You Can Do to Manage Your COVID-19 Symptoms at Home If you have possible or confirmed COVID-19: 1. Stay home from work and school. And stay away from other public places. If you must go out, avoid using any kind of public transportation, ridesharing, or taxis. 2. Monitor your symptoms carefully. If your symptoms get worse, call your healthcare provider immediately. 3. Get rest and stay hydrated. 4. If you have a medical appointment, call the healthcare provider ahead of time and tell them that you have or may have COVID-19. 5. For medical emergencies, call 911 and notify the dispatch personnel that you have or may have COVID-19. 6. Cover your cough and sneezes with a tissue or use the inside of your elbow. 7. Wash your hands often with soap and water for at least 20 seconds or clean your hands with an alcohol-based hand sanitizer that contains at least 60% alcohol. 8. As much as possible, stay in a specific room and away from other people in your home. Also, you should use a separate bathroom, if available. If you need to be around other people in or outside of the home, wear a mask. 9. Avoid sharing personal items with other people in your household, like dishes, towels, and bedding. 10. Clean all surfaces that are touched often, like counters, tabletops, and doorknobs. Use household cleaning sprays or wipes according to the label instructions. cdc.gov/coronavirus 06/04/2019 This information is not intended to replace advice given to you by your health care provider. Make sure you discuss any questions you have with your health care provider. Document Revised: 11/06/2019 Document Reviewed: 11/06/2019 Elsevier Patient Education  2020 Elsevier Inc.  

## 2020-06-28 ENCOUNTER — Ambulatory Visit (HOSPITAL_COMMUNITY)
Admit: 2020-06-28 | Discharge: 2020-06-28 | Disposition: A | Discharge status: Home / Self Care | Payer: Medicare HMO | Attending: Pulmonary Disease | Admitting: Pulmonary Disease

## 2020-06-28 DIAGNOSIS — U071 COVID-19: Secondary | ICD-10-CM | POA: Diagnosis not present

## 2020-06-28 DIAGNOSIS — J1282 Pneumonia due to coronavirus disease 2019: Secondary | ICD-10-CM | POA: Diagnosis not present

## 2020-06-28 MED ORDER — DIPHENHYDRAMINE HCL 50 MG/ML IJ SOLN: 50.0000 mg | Freq: Once | INTRAMUSCULAR | Status: DC | PRN

## 2020-06-28 MED ORDER — ALBUTEROL SULFATE HFA 108 (90 BASE) MCG/ACT IN AERS: 2.0000 | INHALATION_SPRAY | Freq: Once | RESPIRATORY_TRACT | Status: DC | PRN

## 2020-06-28 MED ORDER — FAMOTIDINE IN NACL 20-0.9 MG/50ML-% IV SOLN: 20.0000 mg | Freq: Once | INTRAVENOUS | Status: DC | PRN

## 2020-06-28 MED ORDER — SODIUM CHLORIDE 0.9 % IV SOLN
100.0000 mg | Freq: Once | INTRAVENOUS | Status: AC
  Administered 2020-06-28: 100 mg via INTRAVENOUS
  Filled 2020-06-28: qty 20

## 2020-06-28 MED ORDER — METHYLPREDNISOLONE SODIUM SUCC 125 MG IJ SOLR: 125.0000 mg | Freq: Once | INTRAMUSCULAR | Status: DC | PRN

## 2020-06-28 MED ORDER — SODIUM CHLORIDE 0.9 % IV SOLN: INTRAVENOUS | Status: DC | PRN

## 2020-06-28 MED ORDER — EPINEPHRINE 0.3 MG/0.3ML IJ SOAJ: 0.3000 mg | Freq: Once | INTRAMUSCULAR | Status: DC | PRN

## 2020-06-28 NOTE — Progress Notes (Signed)
°  Diagnosis: COVID-19  Physician: Dr. Asencion Noble   Procedure: Covid Infusion Clinic Med: remdesivir infusion - Provided patient with remdesivir fact sheet for patients, parents and caregivers prior to infusion.  Complications: No immediate complications noted.  Discharge: Discharged home   [place] 06/28/2020

## 2020-06-28 NOTE — Discharge Instructions (Signed)
10 Things You Can Do to Manage Your COVID-19 Symptoms at Home If you have possible or confirmed COVID-19: 1. Stay home from work and school. And stay away from other public places. If you must go out, avoid using any kind of public transportation, ridesharing, or taxis. 2. Monitor your symptoms carefully. If your symptoms get worse, call your healthcare provider immediately. 3. Get rest and stay hydrated. 4. If you have a medical appointment, call the healthcare provider ahead of time and tell them that you have or may have COVID-19. 5. For medical emergencies, call 911 and notify the dispatch personnel that you have or may have COVID-19. 6. Cover your cough and sneezes with a tissue or use the inside of your elbow. 7. Wash your hands often with soap and water for at least 20 seconds or clean your hands with an alcohol-based hand sanitizer that contains at least 60% alcohol. 8. As much as possible, stay in a specific room and away from other people in your home. Also, you should use a separate bathroom, if available. If you need to be around other people in or outside of the home, wear a mask. 9. Avoid sharing personal items with other people in your household, like dishes, towels, and bedding. 10. Clean all surfaces that are touched often, like counters, tabletops, and doorknobs. Use household cleaning sprays or wipes according to the label instructions. cdc.gov/coronavirus 06/04/2019 This information is not intended to replace advice given to you by your health care provider. Make sure you discuss any questions you have with your health care provider. Document Revised: 11/06/2019 Document Reviewed: 11/06/2019 Elsevier Patient Education  2020 Elsevier Inc.  

## 2020-06-29 ENCOUNTER — Ambulatory Visit (HOSPITAL_COMMUNITY)
Admission: RE | Admit: 2020-06-29 | Discharge: 2020-06-29 | Disposition: A | Discharge status: Home / Self Care | Payer: Medicare HMO | Source: Ambulatory Visit | Attending: Pulmonary Disease | Admitting: Pulmonary Disease

## 2020-06-29 DIAGNOSIS — U071 COVID-19: Secondary | ICD-10-CM | POA: Diagnosis not present

## 2020-06-29 DIAGNOSIS — J1282 Pneumonia due to coronavirus disease 2019: Secondary | ICD-10-CM | POA: Diagnosis not present

## 2020-06-29 MED ORDER — EPINEPHRINE 0.3 MG/0.3ML IJ SOAJ: 0.3000 mg | Freq: Once | INTRAMUSCULAR | Status: DC | PRN

## 2020-06-29 MED ORDER — METHYLPREDNISOLONE SODIUM SUCC 125 MG IJ SOLR: 125.0000 mg | Freq: Once | INTRAMUSCULAR | Status: DC | PRN

## 2020-06-29 MED ORDER — SODIUM CHLORIDE 0.9 % IV SOLN
100.0000 mg | Freq: Once | INTRAVENOUS | Status: AC
  Administered 2020-06-29: 100 mg via INTRAVENOUS
  Filled 2020-06-29: qty 20

## 2020-06-29 MED ORDER — DIPHENHYDRAMINE HCL 50 MG/ML IJ SOLN: 50.0000 mg | Freq: Once | INTRAMUSCULAR | Status: DC | PRN

## 2020-06-29 MED ORDER — FAMOTIDINE IN NACL 20-0.9 MG/50ML-% IV SOLN: 20.0000 mg | Freq: Once | INTRAVENOUS | Status: DC | PRN

## 2020-06-29 MED ORDER — ALBUTEROL SULFATE HFA 108 (90 BASE) MCG/ACT IN AERS: 2.0000 | INHALATION_SPRAY | Freq: Once | RESPIRATORY_TRACT | Status: DC | PRN

## 2020-06-29 MED ORDER — SODIUM CHLORIDE 0.9 % IV SOLN: INTRAVENOUS | Status: DC | PRN

## 2020-06-29 NOTE — Discharge Instructions (Signed)
10 Things You Can Do to Manage Your COVID-19 Symptoms at Home If you have possible or confirmed COVID-19: 1. Stay home from work and school. And stay away from other public places. If you must go out, avoid using any kind of public transportation, ridesharing, or taxis. 2. Monitor your symptoms carefully. If your symptoms get worse, call your healthcare provider immediately. 3. Get rest and stay hydrated. 4. If you have a medical appointment, call the healthcare provider ahead of time and tell them that you have or may have COVID-19. 5. For medical emergencies, call 911 and notify the dispatch personnel that you have or may have COVID-19. 6. Cover your cough and sneezes with a tissue or use the inside of your elbow. 7. Wash your hands often with soap and water for at least 20 seconds or clean your hands with an alcohol-based hand sanitizer that contains at least 60% alcohol. 8. As much as possible, stay in a specific room and away from other people in your home. Also, you should use a separate bathroom, if available. If you need to be around other people in or outside of the home, wear a mask. 9. Avoid sharing personal items with other people in your household, like dishes, towels, and bedding. 10. Clean all surfaces that are touched often, like counters, tabletops, and doorknobs. Use household cleaning sprays or wipes according to the label instructions. cdc.gov/coronavirus 06/04/2019 This information is not intended to replace advice given to you by your health care provider. Make sure you discuss any questions you have with your health care provider. Document Revised: 11/06/2019 Document Reviewed: 11/06/2019 Elsevier Patient Education  2020 Elsevier Inc.  

## 2020-06-29 NOTE — Progress Notes (Signed)
  Diagnosis: COVID-19  Physician:Dr Joya Gaskins  Procedure: Covid Infusion Clinic Med: remdesivir infusion - Provided patient with remdesivir fact sheet for patients, parents and caregivers prior to infusion.  Complications: No immediate complications noted.  Discharge: Discharged home   [place], [place] 06/29/2020

## 2020-08-05 DIAGNOSIS — E039 Hypothyroidism, unspecified: Secondary | ICD-10-CM | POA: Diagnosis not present

## 2020-08-05 DIAGNOSIS — I10 Essential (primary) hypertension: Secondary | ICD-10-CM | POA: Diagnosis not present

## 2020-08-05 DIAGNOSIS — E119 Type 2 diabetes mellitus without complications: Secondary | ICD-10-CM | POA: Diagnosis not present

## 2020-08-05 DIAGNOSIS — R3989 Other symptoms and signs involving the genitourinary system: Secondary | ICD-10-CM | POA: Diagnosis not present

## 2020-08-05 DIAGNOSIS — E7800 Pure hypercholesterolemia, unspecified: Secondary | ICD-10-CM | POA: Diagnosis not present

## 2020-08-05 DIAGNOSIS — Z79899 Other long term (current) drug therapy: Secondary | ICD-10-CM | POA: Diagnosis not present

## 2020-08-05 DIAGNOSIS — K219 Gastro-esophageal reflux disease without esophagitis: Secondary | ICD-10-CM | POA: Diagnosis not present

## 2020-08-05 DIAGNOSIS — F339 Major depressive disorder, recurrent, unspecified: Secondary | ICD-10-CM | POA: Diagnosis not present

## 2020-08-05 DIAGNOSIS — F411 Generalized anxiety disorder: Secondary | ICD-10-CM | POA: Diagnosis not present

## 2020-08-30 DIAGNOSIS — Z23 Encounter for immunization: Secondary | ICD-10-CM | POA: Diagnosis not present

## 2020-08-30 DIAGNOSIS — R300 Dysuria: Secondary | ICD-10-CM | POA: Diagnosis not present

## 2020-08-30 DIAGNOSIS — K121 Other forms of stomatitis: Secondary | ICD-10-CM | POA: Diagnosis not present

## 2020-08-30 DIAGNOSIS — R1030 Lower abdominal pain, unspecified: Secondary | ICD-10-CM | POA: Diagnosis not present

## 2020-08-30 DIAGNOSIS — Z6834 Body mass index (BMI) 34.0-34.9, adult: Secondary | ICD-10-CM | POA: Diagnosis not present

## 2020-08-30 DIAGNOSIS — M545 Low back pain: Secondary | ICD-10-CM | POA: Diagnosis not present

## 2020-10-12 DIAGNOSIS — J45909 Unspecified asthma, uncomplicated: Secondary | ICD-10-CM | POA: Diagnosis not present

## 2020-10-12 DIAGNOSIS — J019 Acute sinusitis, unspecified: Secondary | ICD-10-CM | POA: Diagnosis not present

## 2020-10-12 DIAGNOSIS — Z6834 Body mass index (BMI) 34.0-34.9, adult: Secondary | ICD-10-CM | POA: Diagnosis not present

## 2020-11-11 DIAGNOSIS — F339 Major depressive disorder, recurrent, unspecified: Secondary | ICD-10-CM | POA: Diagnosis not present

## 2020-11-11 DIAGNOSIS — I10 Essential (primary) hypertension: Secondary | ICD-10-CM | POA: Diagnosis not present

## 2020-11-11 DIAGNOSIS — E7800 Pure hypercholesterolemia, unspecified: Secondary | ICD-10-CM | POA: Diagnosis not present

## 2020-11-11 DIAGNOSIS — F411 Generalized anxiety disorder: Secondary | ICD-10-CM | POA: Diagnosis not present

## 2020-11-11 DIAGNOSIS — E119 Type 2 diabetes mellitus without complications: Secondary | ICD-10-CM | POA: Diagnosis not present

## 2020-11-11 DIAGNOSIS — E039 Hypothyroidism, unspecified: Secondary | ICD-10-CM | POA: Diagnosis not present

## 2020-11-11 DIAGNOSIS — J45909 Unspecified asthma, uncomplicated: Secondary | ICD-10-CM | POA: Diagnosis not present

## 2020-11-11 DIAGNOSIS — G629 Polyneuropathy, unspecified: Secondary | ICD-10-CM | POA: Diagnosis not present

## 2020-11-11 DIAGNOSIS — K219 Gastro-esophageal reflux disease without esophagitis: Secondary | ICD-10-CM | POA: Diagnosis not present

## 2020-12-10 ENCOUNTER — Inpatient Hospital Stay (HOSPITAL_COMMUNITY)
Admission: EM | Admit: 2020-12-10 | Discharge: 2020-12-17 | DRG: 202 | Disposition: A | Discharge status: Home Health | Payer: Medicare Other | Attending: Internal Medicine | Admitting: Internal Medicine

## 2020-12-10 ENCOUNTER — Emergency Department (HOSPITAL_COMMUNITY): Payer: Medicare Other

## 2020-12-10 ENCOUNTER — Other Ambulatory Visit: Payer: Self-pay

## 2020-12-10 DIAGNOSIS — E785 Hyperlipidemia, unspecified: Secondary | ICD-10-CM | POA: Diagnosis present

## 2020-12-10 DIAGNOSIS — J449 Chronic obstructive pulmonary disease, unspecified: Secondary | ICD-10-CM | POA: Diagnosis present

## 2020-12-10 DIAGNOSIS — E1169 Type 2 diabetes mellitus with other specified complication: Secondary | ICD-10-CM | POA: Diagnosis present

## 2020-12-10 DIAGNOSIS — J45901 Unspecified asthma with (acute) exacerbation: Secondary | ICD-10-CM

## 2020-12-10 DIAGNOSIS — Z79890 Hormone replacement therapy (postmenopausal): Secondary | ICD-10-CM

## 2020-12-10 DIAGNOSIS — R42 Dizziness and giddiness: Secondary | ICD-10-CM | POA: Diagnosis present

## 2020-12-10 DIAGNOSIS — Z7984 Long term (current) use of oral hypoglycemic drugs: Secondary | ICD-10-CM

## 2020-12-10 DIAGNOSIS — E8770 Fluid overload, unspecified: Secondary | ICD-10-CM | POA: Diagnosis present

## 2020-12-10 DIAGNOSIS — E1165 Type 2 diabetes mellitus with hyperglycemia: Secondary | ICD-10-CM | POA: Diagnosis present

## 2020-12-10 DIAGNOSIS — Z6833 Body mass index (BMI) 33.0-33.9, adult: Secondary | ICD-10-CM

## 2020-12-10 DIAGNOSIS — R1310 Dysphagia, unspecified: Secondary | ICD-10-CM

## 2020-12-10 DIAGNOSIS — J4541 Moderate persistent asthma with (acute) exacerbation: Principal | ICD-10-CM | POA: Diagnosis present

## 2020-12-10 DIAGNOSIS — T502X5A Adverse effect of carbonic-anhydrase inhibitors, benzothiadiazides and other diuretics, initial encounter: Secondary | ICD-10-CM | POA: Diagnosis not present

## 2020-12-10 DIAGNOSIS — F32A Depression, unspecified: Secondary | ICD-10-CM | POA: Diagnosis present

## 2020-12-10 DIAGNOSIS — R269 Unspecified abnormalities of gait and mobility: Secondary | ICD-10-CM | POA: Diagnosis present

## 2020-12-10 DIAGNOSIS — Z8249 Family history of ischemic heart disease and other diseases of the circulatory system: Secondary | ICD-10-CM

## 2020-12-10 DIAGNOSIS — Z79899 Other long term (current) drug therapy: Secondary | ICD-10-CM

## 2020-12-10 DIAGNOSIS — E1140 Type 2 diabetes mellitus with diabetic neuropathy, unspecified: Secondary | ICD-10-CM | POA: Diagnosis present

## 2020-12-10 DIAGNOSIS — E039 Hypothyroidism, unspecified: Secondary | ICD-10-CM | POA: Diagnosis present

## 2020-12-10 DIAGNOSIS — Z7951 Long term (current) use of inhaled steroids: Secondary | ICD-10-CM

## 2020-12-10 DIAGNOSIS — F419 Anxiety disorder, unspecified: Secondary | ICD-10-CM | POA: Diagnosis present

## 2020-12-10 DIAGNOSIS — Z8701 Personal history of pneumonia (recurrent): Secondary | ICD-10-CM

## 2020-12-10 DIAGNOSIS — K219 Gastro-esophageal reflux disease without esophagitis: Secondary | ICD-10-CM | POA: Diagnosis present

## 2020-12-10 DIAGNOSIS — E669 Obesity, unspecified: Secondary | ICD-10-CM | POA: Diagnosis present

## 2020-12-10 DIAGNOSIS — N179 Acute kidney failure, unspecified: Secondary | ICD-10-CM | POA: Diagnosis not present

## 2020-12-10 DIAGNOSIS — Z8616 Personal history of COVID-19: Secondary | ICD-10-CM

## 2020-12-10 HISTORY — DX: Unspecified asthma with (acute) exacerbation: J45.901

## 2020-12-10 LAB — BASIC METABOLIC PANEL
Anion gap: 13 (ref 5–15)
BUN: 11 mg/dL (ref 8–23)
CO2: 21 mmol/L — ABNORMAL LOW (ref 22–32)
Calcium: 10.2 mg/dL (ref 8.9–10.3)
Chloride: 103 mmol/L (ref 98–111)
Creatinine, Ser: 1 mg/dL (ref 0.44–1.00)
GFR, Estimated: >60 mL/min (ref >60)
Glucose, Bld: 160 mg/dL — ABNORMAL HIGH (ref 70–99)
Potassium: 4.3 mmol/L (ref 3.5–5.1)
Sodium: 137 mmol/L (ref 135–145)

## 2020-12-10 LAB — CBC
HCT: 37.3 % (ref 36.0–46.0)
Hemoglobin: 12.5 g/dL (ref 12.0–15.0)
MCH: 29.3 pg (ref 26.0–34.0)
MCHC: 33.5 g/dL (ref 30.0–36.0)
MCV: 87.4 fL (ref 80.0–100.0)
Platelets: 357 10*3/uL (ref 150–400)
RBC: 4.27 MIL/uL (ref 3.87–5.11)
RDW: 13.3 % (ref 11.5–15.5)
WBC: 11.3 10*3/uL — ABNORMAL HIGH (ref 4.0–10.5)
nRBC: 0 % (ref 0.0–0.2)

## 2020-12-10 MED ORDER — IPRATROPIUM-ALBUTEROL 0.5-2.5 (3) MG/3ML IN SOLN
3.0000 mL | Freq: Once | RESPIRATORY_TRACT | Status: AC
  Administered 2020-12-11: 3 mL via RESPIRATORY_TRACT
  Filled 2020-12-10: qty 3

## 2020-12-10 MED ORDER — IOHEXOL 350 MG/ML SOLN
75.0000 mL | Freq: Once | INTRAVENOUS | Status: AC | PRN
  Administered 2020-12-10: 75 mL via INTRAVENOUS

## 2020-12-10 MED ORDER — METHYLPREDNISOLONE SODIUM SUCC 125 MG IJ SOLR
125.0000 mg | Freq: Once | INTRAMUSCULAR | Status: AC
  Administered 2020-12-10: 125 mg via INTRAVENOUS
  Filled 2020-12-10: qty 2

## 2020-12-10 MED ORDER — ALBUTEROL SULFATE HFA 108 (90 BASE) MCG/ACT IN AERS
2.0000 | INHALATION_SPRAY | RESPIRATORY_TRACT | Status: DC | PRN
  Filled 2020-12-10: qty 6.7

## 2020-12-10 NOTE — ED Triage Notes (Signed)
Pt here with reports of asthma exacerbation for the last week with sudden worsening last night. Pt talking in fragmented sentences. Has been using inhalers at home without relief.
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
## 2020-12-10 NOTE — ED Notes (Signed)
Pt very labored breathing while ambulating, RR jump up to 55rpm, SPO2 decrease from 100% to 95%.

## 2020-12-10 NOTE — ED Provider Notes (Signed)
Medical screening examination/treatment/procedure(s) were conducted as a shared visit with non-physician practitioner(s) and myself.  I personally evaluated the patient during the encounter.  EKG Interpretation  Date/Time:  Friday December 10 2020 12:58:09 EST Ventricular Rate:  79 PR Interval:  170 QRS Duration: 68 QT Interval:  388 QTC Calculation: 444 R Axis:   78 Text Interpretation: Normal sinus rhythm Normal ECG agree, no sig change from previous Confirmed by Charlesetta Shanks [phone]) on 12/10/2020 10:59:28 PM Has history of significant asthma and COPD.  Had increasing shortness of breath.  Reports she did get treated with antibiotics several weeks ago.  She has been vaccinated for COVID.  She actually had COVID last summer.  She has not had fevers or chills.  She reports that she got quite short of breath last night has been trying her inhalers with no relief.  She reports with minimal exertion she is breathing very hard  Patient is alert.  She has tachypnea and increased work of breathing she is seen after having had Solu-Medrol and inhaled albuterol heart is regular no rub murmur gallop.  Breath sounds slightly decreased at bases with occasional fine wheeze. No peripheral edema calves are soft and nontender.  CT PE study is negative for PE.  No signs of patchy pneumonia to suggest COVID.  Patient has been vaccinated.  She remains very tachypneic even with minor activity after Solu-Medrol and albuterol.  We will plan for admission for COPD\asthma exacerbation.   Charlesetta Shanks, MD 12/10/20 [phone]

## 2020-12-11 ENCOUNTER — Encounter (HOSPITAL_COMMUNITY): Payer: Self-pay | Admitting: Internal Medicine

## 2020-12-11 DIAGNOSIS — Z8616 Personal history of COVID-19: Secondary | ICD-10-CM | POA: Diagnosis not present

## 2020-12-11 DIAGNOSIS — J449 Chronic obstructive pulmonary disease, unspecified: Secondary | ICD-10-CM | POA: Diagnosis present

## 2020-12-11 DIAGNOSIS — E669 Obesity, unspecified: Secondary | ICD-10-CM

## 2020-12-11 DIAGNOSIS — E1140 Type 2 diabetes mellitus with diabetic neuropathy, unspecified: Secondary | ICD-10-CM | POA: Diagnosis present

## 2020-12-11 DIAGNOSIS — F32A Depression, unspecified: Secondary | ICD-10-CM | POA: Diagnosis present

## 2020-12-11 DIAGNOSIS — E1169 Type 2 diabetes mellitus with other specified complication: Secondary | ICD-10-CM | POA: Diagnosis present

## 2020-12-11 DIAGNOSIS — Z8701 Personal history of pneumonia (recurrent): Secondary | ICD-10-CM | POA: Diagnosis not present

## 2020-12-11 DIAGNOSIS — Z6833 Body mass index (BMI) 33.0-33.9, adult: Secondary | ICD-10-CM | POA: Diagnosis not present

## 2020-12-11 DIAGNOSIS — R42 Dizziness and giddiness: Secondary | ICD-10-CM | POA: Diagnosis present

## 2020-12-11 DIAGNOSIS — Z7951 Long term (current) use of inhaled steroids: Secondary | ICD-10-CM | POA: Diagnosis not present

## 2020-12-11 DIAGNOSIS — Z8249 Family history of ischemic heart disease and other diseases of the circulatory system: Secondary | ICD-10-CM | POA: Diagnosis not present

## 2020-12-11 DIAGNOSIS — Z7984 Long term (current) use of oral hypoglycemic drugs: Secondary | ICD-10-CM | POA: Diagnosis not present

## 2020-12-11 DIAGNOSIS — Z79899 Other long term (current) drug therapy: Secondary | ICD-10-CM | POA: Diagnosis not present

## 2020-12-11 DIAGNOSIS — R0602 Shortness of breath: Secondary | ICD-10-CM | POA: Diagnosis not present

## 2020-12-11 DIAGNOSIS — E785 Hyperlipidemia, unspecified: Secondary | ICD-10-CM | POA: Diagnosis present

## 2020-12-11 DIAGNOSIS — E1165 Type 2 diabetes mellitus with hyperglycemia: Secondary | ICD-10-CM | POA: Diagnosis present

## 2020-12-11 DIAGNOSIS — N179 Acute kidney failure, unspecified: Secondary | ICD-10-CM | POA: Diagnosis not present

## 2020-12-11 DIAGNOSIS — Z79890 Hormone replacement therapy (postmenopausal): Secondary | ICD-10-CM | POA: Diagnosis not present

## 2020-12-11 DIAGNOSIS — K219 Gastro-esophageal reflux disease without esophagitis: Secondary | ICD-10-CM | POA: Diagnosis present

## 2020-12-11 DIAGNOSIS — E039 Hypothyroidism, unspecified: Secondary | ICD-10-CM | POA: Diagnosis present

## 2020-12-11 DIAGNOSIS — T502X5A Adverse effect of carbonic-anhydrase inhibitors, benzothiadiazides and other diuretics, initial encounter: Secondary | ICD-10-CM | POA: Diagnosis not present

## 2020-12-11 DIAGNOSIS — R269 Unspecified abnormalities of gait and mobility: Secondary | ICD-10-CM | POA: Diagnosis present

## 2020-12-11 DIAGNOSIS — J4541 Moderate persistent asthma with (acute) exacerbation: Secondary | ICD-10-CM | POA: Diagnosis not present

## 2020-12-11 DIAGNOSIS — F419 Anxiety disorder, unspecified: Secondary | ICD-10-CM | POA: Diagnosis present

## 2020-12-11 DIAGNOSIS — E8770 Fluid overload, unspecified: Secondary | ICD-10-CM | POA: Diagnosis present

## 2020-12-11 HISTORY — DX: Type 2 diabetes mellitus with other specified complication: E66.9

## 2020-12-11 HISTORY — DX: Type 2 diabetes mellitus with other specified complication: E11.69

## 2020-12-11 LAB — CBC
HCT: 36.6 % (ref 36.0–46.0)
Hemoglobin: 12.2 g/dL (ref 12.0–15.0)
MCH: 29.7 pg (ref 26.0–34.0)
MCHC: 33.3 g/dL (ref 30.0–36.0)
MCV: 89.1 fL (ref 80.0–100.0)
Platelets: 363 10*3/uL (ref 150–400)
RBC: 4.11 MIL/uL (ref 3.87–5.11)
RDW: 13.4 % (ref 11.5–15.5)
WBC: 11.3 10*3/uL — ABNORMAL HIGH (ref 4.0–10.5)
nRBC: 0 % (ref 0.0–0.2)

## 2020-12-11 LAB — I-STAT ARTERIAL BLOOD GAS, ED
Acid-base deficit: 6 mmol/L — ABNORMAL HIGH (ref 0.0–2.0)
Bicarbonate: 19.4 mmol/L — ABNORMAL LOW (ref 20.0–28.0)
Calcium, Ion: 1.29 mmol/L (ref 1.15–1.40)
HCT: 35 % — ABNORMAL LOW (ref 36.0–46.0)
Hemoglobin: 11.9 g/dL — ABNORMAL LOW (ref 12.0–15.0)
O2 Saturation: 96 %
Patient temperature: 99.2
Potassium: 4.3 mmol/L (ref 3.5–5.1)
Sodium: 138 mmol/L (ref 135–145)
TCO2: 20 mmol/L — ABNORMAL LOW (ref 22–32)
pCO2 arterial: 35.9 mmHg (ref 32.0–48.0)
pH, Arterial: 7.343 — ABNORMAL LOW (ref 7.350–7.450)
pO2, Arterial: 86 mmHg (ref 83.0–108.0)

## 2020-12-11 LAB — BASIC METABOLIC PANEL
Anion gap: 15 (ref 5–15)
BUN: 20 mg/dL (ref 8–23)
CO2: 21 mmol/L — ABNORMAL LOW (ref 22–32)
Calcium: 9.6 mg/dL (ref 8.9–10.3)
Chloride: 101 mmol/L (ref 98–111)
Creatinine, Ser: 1.79 mg/dL — ABNORMAL HIGH (ref 0.44–1.00)
GFR, Estimated: 31 mL/min — ABNORMAL LOW (ref >60)
Glucose, Bld: 273 mg/dL — ABNORMAL HIGH (ref 70–99)
Potassium: 4.2 mmol/L (ref 3.5–5.1)
Sodium: 137 mmol/L (ref 135–145)

## 2020-12-11 LAB — BRAIN NATRIURETIC PEPTIDE: B Natriuretic Peptide: 31.1 pg/mL (ref 0.0–100.0)

## 2020-12-11 LAB — GLUCOSE, CAPILLARY
Glucose-Capillary: 227 mg/dL — ABNORMAL HIGH (ref 70–99)
Glucose-Capillary: 328 mg/dL — ABNORMAL HIGH (ref 70–99)

## 2020-12-11 LAB — RESP PANEL BY RT-PCR (FLU A&B, COVID) ARPGX2
Influenza A by PCR: NEGATIVE
Influenza B by PCR: NEGATIVE
SARS Coronavirus 2 by RT PCR: NEGATIVE

## 2020-12-11 LAB — TROPONIN I (HIGH SENSITIVITY): Troponin I (High Sensitivity): 5 ng/L (ref <18)

## 2020-12-11 LAB — CBG MONITORING, ED
Glucose-Capillary: 236 mg/dL — ABNORMAL HIGH (ref 70–99)
Glucose-Capillary: 277 mg/dL — ABNORMAL HIGH (ref 70–99)

## 2020-12-11 MED ORDER — ACETAMINOPHEN 325 MG PO TABS
650.0000 mg | ORAL_TABLET | Freq: Four times a day (QID) | ORAL | Status: DC | PRN
  Administered 2020-12-11 – 2020-12-17 (×8): 650 mg via ORAL
  Filled 2020-12-11 (×8): qty 2

## 2020-12-11 MED ORDER — INSULIN ASPART 100 UNIT/ML ~~LOC~~ SOLN
0.0000 [IU] | Freq: Three times a day (TID) | SUBCUTANEOUS | Status: DC
  Administered 2020-12-11: 3 [IU] via SUBCUTANEOUS
  Administered 2020-12-11: 5 [IU] via SUBCUTANEOUS
  Administered 2020-12-12 (×2): 2 [IU] via SUBCUTANEOUS
  Administered 2020-12-12 – 2020-12-13 (×2): 3 [IU] via SUBCUTANEOUS
  Administered 2020-12-13: 1 [IU] via SUBCUTANEOUS
  Administered 2020-12-13: 3 [IU] via SUBCUTANEOUS
  Administered 2020-12-14: 1 [IU] via SUBCUTANEOUS
  Administered 2020-12-14: 3 [IU] via SUBCUTANEOUS
  Administered 2020-12-15 – 2020-12-17 (×4): 1 [IU] via SUBCUTANEOUS

## 2020-12-11 MED ORDER — FUROSEMIDE 10 MG/ML IJ SOLN
40.0000 mg | Freq: Once | INTRAMUSCULAR | Status: AC
  Administered 2020-12-11: 40 mg via INTRAVENOUS
  Filled 2020-12-11: qty 4

## 2020-12-11 MED ORDER — METHYLPREDNISOLONE SODIUM SUCC 40 MG IJ SOLR
40.0000 mg | Freq: Two times a day (BID) | INTRAMUSCULAR | Status: DC
  Administered 2020-12-11: 40 mg via INTRAVENOUS
  Filled 2020-12-11: qty 1

## 2020-12-11 MED ORDER — INSULIN GLARGINE 100 UNIT/ML ~~LOC~~ SOLN
10.0000 [IU] | Freq: Every day | SUBCUTANEOUS | Status: DC
  Administered 2020-12-11 – 2020-12-12 (×2): 10 [IU] via SUBCUTANEOUS
  Filled 2020-12-11 (×2): qty 0.1

## 2020-12-11 MED ORDER — PANTOPRAZOLE SODIUM 40 MG PO TBEC
40.0000 mg | DELAYED_RELEASE_TABLET | Freq: Two times a day (BID) | ORAL | Status: DC
  Administered 2020-12-11 – 2020-12-13 (×5): 40 mg via ORAL
  Filled 2020-12-11 (×5): qty 1

## 2020-12-11 MED ORDER — INSULIN ASPART 100 UNIT/ML ~~LOC~~ SOLN
5.0000 [IU] | Freq: Once | SUBCUTANEOUS | Status: AC
  Administered 2020-12-11: 5 [IU] via SUBCUTANEOUS

## 2020-12-11 MED ORDER — BUDESONIDE 0.25 MG/2ML IN SUSP
0.2500 mg | Freq: Two times a day (BID) | RESPIRATORY_TRACT | Status: DC
  Administered 2020-12-11 – 2020-12-15 (×9): 0.25 mg via RESPIRATORY_TRACT
  Filled 2020-12-11 (×10): qty 2

## 2020-12-11 MED ORDER — GEMFIBROZIL 600 MG PO TABS
600.0000 mg | ORAL_TABLET | Freq: Two times a day (BID) | ORAL | Status: DC
  Administered 2020-12-11 – 2020-12-17 (×10): 600 mg via ORAL
  Filled 2020-12-11 (×14): qty 1

## 2020-12-11 MED ORDER — SODIUM CHLORIDE 0.9 % IV SOLN: INTRAVENOUS | Status: AC

## 2020-12-11 MED ORDER — ENOXAPARIN SODIUM 40 MG/0.4ML ~~LOC~~ SOLN
40.0000 mg | SUBCUTANEOUS | Status: DC
  Administered 2020-12-11 – 2020-12-17 (×7): 40 mg via SUBCUTANEOUS
  Filled 2020-12-11 (×11): qty 0.4

## 2020-12-11 MED ORDER — SODIUM CHLORIDE 0.9 % IV BOLUS
500.0000 mL | Freq: Once | INTRAVENOUS | Status: AC
  Administered 2020-12-11: 500 mL via INTRAVENOUS

## 2020-12-11 MED ORDER — ALBUTEROL SULFATE (2.5 MG/3ML) 0.083% IN NEBU
2.5000 mg | INHALATION_SOLUTION | RESPIRATORY_TRACT | Status: DC | PRN
  Administered 2020-12-12: 2.5 mg via RESPIRATORY_TRACT
  Filled 2020-12-11: qty 3

## 2020-12-11 MED ORDER — ACETAMINOPHEN 650 MG RE SUPP: 650.0000 mg | Freq: Four times a day (QID) | RECTAL | Status: DC | PRN

## 2020-12-11 MED ORDER — ROSUVASTATIN CALCIUM 5 MG PO TABS
10.0000 mg | ORAL_TABLET | Freq: Every day | ORAL | Status: DC
  Administered 2020-12-11 – 2020-12-16 (×6): 10 mg via ORAL
  Filled 2020-12-11 (×7): qty 2

## 2020-12-11 MED ORDER — LEVOTHYROXINE SODIUM 100 MCG PO TABS
200.0000 ug | ORAL_TABLET | Freq: Every day | ORAL | Status: DC
  Administered 2020-12-11 – 2020-12-17 (×6): 200 ug via ORAL
  Filled 2020-12-11 (×6): qty 2

## 2020-12-11 MED ORDER — METHYLPREDNISOLONE SODIUM SUCC 40 MG IJ SOLR
40.0000 mg | Freq: Three times a day (TID) | INTRAMUSCULAR | Status: DC
  Administered 2020-12-11 – 2020-12-12 (×3): 40 mg via INTRAVENOUS
  Filled 2020-12-11 (×3): qty 1

## 2020-12-11 MED ORDER — ALBUTEROL SULFATE (2.5 MG/3ML) 0.083% IN NEBU
2.5000 mg | INHALATION_SOLUTION | RESPIRATORY_TRACT | Status: DC
  Administered 2020-12-11 – 2020-12-12 (×6): 2.5 mg via RESPIRATORY_TRACT
  Filled 2020-12-11 (×7): qty 3

## 2020-12-11 NOTE — ED Notes (Signed)
This RN called RT to come draw ABG

## 2020-12-11 NOTE — Progress Notes (Signed)
Chelsea Kerr is a 65 y.o. female with history of asthma, diabetes mellitus, hyperlipidemia, hypothyroidism and depression presents to the ER with complaint of shortness of breath.  Patient has been having shortness of breath off and on for last 1 week with some productive cough.  Denies fever chills or chest pain.  Patient states she has been compliant with her inhaler despite which patient was still wheezing and short of breath.  ED Course: In the ER patient was found to be diffusely wheezing CT angiogram of the chest was negative for PE but does show bronchial wall thickening.  Patient was started on nebulizer therapy after Covid test was negative.  Admitted for asthma exacerbation.  Labs are largely unremarkable.  BNP and high sensitive troponins were negative.  EKG shows normal sinus rhythm.  12/11/20: Seen and examined at bedside in the ED.  She continues to be dyspneic with minimal exertion.  Conversational dyspnea noted on exam.  We will increase frequency of IV Solu-Medrol from 40 mg twice daily to 40 mg 3 times daily.  Scheduled bronchodilators.  We will also give a dose of IV Lasix due to volume overload on exam.  Please refer to H&P dictated by partner Dr. Hal Hope on 12/11/2020 for further details of the assessment and plan.

## 2020-12-11 NOTE — ED Notes (Signed)
Attempted to give report x1. RN will call back per NS.

## 2020-12-11 NOTE — ED Notes (Signed)
Tele; Breakfast Order placed 
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
## 2020-12-11 NOTE — ED Notes (Signed)
CBG 236. 

## 2020-12-11 NOTE — ED Notes (Signed)
Pt brought [place] sandwich bag and water
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
## 2020-12-12 LAB — BASIC METABOLIC PANEL
Anion gap: 14 (ref 5–15)
BUN: 22 mg/dL (ref 8–23)
CO2: 20 mmol/L — ABNORMAL LOW (ref 22–32)
Calcium: 9.6 mg/dL (ref 8.9–10.3)
Chloride: 102 mmol/L (ref 98–111)
Creatinine, Ser: 1.39 mg/dL — ABNORMAL HIGH (ref 0.44–1.00)
GFR, Estimated: 43 mL/min — ABNORMAL LOW (ref >60)
Glucose, Bld: 237 mg/dL — ABNORMAL HIGH (ref 70–99)
Potassium: 4.3 mmol/L (ref 3.5–5.1)
Sodium: 136 mmol/L (ref 135–145)

## 2020-12-12 LAB — HEMOGLOBIN A1C
Hgb A1c MFr Bld: 6.7 % — ABNORMAL HIGH (ref 4.8–5.6)
Mean Plasma Glucose: 145.59 mg/dL

## 2020-12-12 LAB — GLUCOSE, CAPILLARY
Glucose-Capillary: 197 mg/dL — ABNORMAL HIGH (ref 70–99)
Glucose-Capillary: 157 mg/dL — ABNORMAL HIGH (ref 70–99)
Glucose-Capillary: 229 mg/dL — ABNORMAL HIGH (ref 70–99)
Glucose-Capillary: 194 mg/dL — ABNORMAL HIGH (ref 70–99)

## 2020-12-12 LAB — MAGNESIUM: Magnesium: 1.8 mg/dL (ref 1.7–2.4)

## 2020-12-12 MED ORDER — METHYLPREDNISOLONE SODIUM SUCC 125 MG IJ SOLR
60.0000 mg | Freq: Two times a day (BID) | INTRAMUSCULAR | Status: DC
  Administered 2020-12-12 – 2020-12-13 (×2): 60 mg via INTRAVENOUS
  Filled 2020-12-12 (×2): qty 2

## 2020-12-12 MED ORDER — BUSPIRONE HCL 10 MG PO TABS
10.0000 mg | ORAL_TABLET | Freq: Three times a day (TID) | ORAL | Status: DC
  Administered 2020-12-12 – 2020-12-15 (×10): 10 mg via ORAL
  Filled 2020-12-12 (×11): qty 1

## 2020-12-12 MED ORDER — ALBUTEROL SULFATE (2.5 MG/3ML) 0.083% IN NEBU
2.5000 mg | INHALATION_SOLUTION | Freq: Four times a day (QID) | RESPIRATORY_TRACT | Status: DC
  Administered 2020-12-12 – 2020-12-14 (×11): 2.5 mg via RESPIRATORY_TRACT
  Filled 2020-12-12 (×13): qty 3

## 2020-12-12 MED ORDER — GABAPENTIN 600 MG PO TABS
300.0000 mg | ORAL_TABLET | Freq: Three times a day (TID) | ORAL | Status: DC
Start: 2020-12-12 — End: 2020-12-17
  Administered 2020-12-12 – 2020-12-17 (×16): 300 mg via ORAL
  Filled 2020-12-12 (×16): qty 1

## 2020-12-12 MED ORDER — FUROSEMIDE 10 MG/ML IJ SOLN
40.0000 mg | Freq: Once | INTRAMUSCULAR | Status: AC
  Administered 2020-12-12: 40 mg via INTRAVENOUS
  Filled 2020-12-12: qty 4

## 2020-12-12 MED ORDER — BUPROPION HCL ER (XL) 150 MG PO TB24
300.0000 mg | ORAL_TABLET | Freq: Every day | ORAL | Status: DC
  Administered 2020-12-12 – 2020-12-17 (×6): 300 mg via ORAL
  Filled 2020-12-12 (×6): qty 2

## 2020-12-12 MED ORDER — INSULIN ASPART 100 UNIT/ML ~~LOC~~ SOLN
5.0000 [IU] | Freq: Three times a day (TID) | SUBCUTANEOUS | Status: DC
  Administered 2020-12-12 – 2020-12-13 (×3): 5 [IU] via SUBCUTANEOUS

## 2020-12-12 MED ORDER — ALPRAZOLAM 0.5 MG PO TABS
1.0000 mg | ORAL_TABLET | Freq: Three times a day (TID) | ORAL | Status: DC | PRN
  Administered 2020-12-15 – 2020-12-16 (×2): 1 mg via ORAL
  Filled 2020-12-12 (×2): qty 2

## 2020-12-12 MED ORDER — INSULIN GLARGINE 100 UNIT/ML ~~LOC~~ SOLN
10.0000 [IU] | Freq: Two times a day (BID) | SUBCUTANEOUS | Status: DC
  Administered 2020-12-12 – 2020-12-13 (×2): 10 [IU] via SUBCUTANEOUS
  Filled 2020-12-12 (×3): qty 0.1

## 2020-12-12 MED ORDER — ZOLPIDEM TARTRATE 5 MG PO TABS
5.0000 mg | ORAL_TABLET | Freq: Every evening | ORAL | Status: DC | PRN
  Administered 2020-12-15: 5 mg via ORAL
  Filled 2020-12-12 (×2): qty 1

## 2020-12-12 MED ORDER — CYCLOBENZAPRINE HCL 10 MG PO TABS: 10.0000 mg | ORAL_TABLET | Freq: Three times a day (TID) | ORAL | Status: DC | PRN

## 2020-12-12 MED ORDER — VITAMIN D 25 MCG (1000 UNIT) PO TABS
1000.0000 [IU] | ORAL_TABLET | Freq: Every day | ORAL | Status: DC
  Administered 2020-12-12 – 2020-12-17 (×6): 1000 [IU] via ORAL
  Filled 2020-12-12 (×6): qty 1

## 2020-12-12 NOTE — Progress Notes (Addendum)
PROGRESS NOTE  Chelsea Kerr [id number] DOB: Jan 26, 1956 DOA: 12/10/2020 PCP: Isaias Sakai, DO  HPI/Recap of past 24 hours:  Chelsea Kerr a 65 y.o.femalewithhistory of asthma, diabetes mellitus, hyperlipidemia, hypothyroidism and depression presents to the ER with complaint of shortness of breath. Patient has been having shortness of breath off and on for last 1 week with some productive cough. Denies fever chills or chest pain. Patient states she has been compliant with her inhaler despite which patient was still wheezing and short of breath.  ED Course:In the ER patient was found to be diffusely wheezing CT angiogram of the chest was negative for PE but does show bronchial wall thickening. Patient was started on nebulizer therapy after Covid test was negative. Admitted for asthma exacerbation. Labs are largely unremarkable. BNP and high sensitive troponins were negative. EKG shows normal sinus rhythm.  Conversational dyspnea noted on exam.  We will increase frequency of IV Solu-Medrol from 40 mg twice daily to 40 mg 3 times daily.  Scheduled bronchodilators.  We will also give a dose of IV Lasix due to volume overload on exam.  12/12/20: Patient was seen and examined at bedside.  She reports still having significant tightness in her chest with dyspnea on exertion.  IV Solu-Medrol dose increased to 60 mg twice daily.  We continue bronchodilators scheduled.  Assessment/Plan: Principal Problem:   Asthma exacerbation Active Problems:   Diabetes mellitus type 2 in obese ([place])   Acute asthma exacerbation Unclear triggers Chest x-ray done on 12/10/2020 personally reviewed showing no lobular infiltrates or increase in pulmonary vascularity to suggest pulmonary edema, no active cardiopulmonary disease. She was started on IV Solu-Medrol in the ED, continued. She is currently on IV Solu-Medrol 60 mg twice daily. She is also on scheduled bronchodilators. Has received IV  Lasix intermittently O2 saturations currently stable.  Type 2 diabetes with hyperglycemia, exacerbated by high doses of IV Solu-Medrol. Hemoglobin A1c Increase Lantus to 10 units twice daily Start NovoLog 5 unit 3 times daily Continue SSI scale.  Diabetic neuropathy Resume home regimen  Hypothyroidism Continue home levothyroxine  Chronic anxiety/depression Resume home regimen  GERD Continue home PPI  Hyperlipidemia Continue home Crestor  History of COVID-19 pneumonia in July 2021 Positive COVID-19 test on 06/21/2020 was admitted and treated for COVID-19 pneumonia. Repeated COVID-19 screening test on 12/10/2020 -.    Code Status: Full code.  Family Communication: None at bedside  Disposition Plan: Home likely on 12/13/2020.  Consultants:  Pulmonary  Procedures:  2D echo  Antimicrobials:  None  DVT prophylaxis:  Subcu Lovenox daily   Status is: Inpatient   Dispo:  Patient From: Home  Planned Disposition: Home  Expected discharge date: 12/12/2020  Medically stable for discharge: No, ongoing management of acute asthma exacerbation.         Objective: Vitals:   12/12/20 0814 12/12/20 1057 12/12/20 1226 12/12/20 1514  BP:  131/65    Pulse: 76 73  71  Resp: 18 17  18   Temp:  98 F (36.7 C)    TempSrc:  Oral    SpO2: 96% 99%  98%  Weight:   83.1 kg   Height:        Intake/Output Summary (Last 24 hours) at 12/12/2020 1516 Last data filed at 12/12/2020 1422 Gross per 24 hour  Intake 360 ml  Output 1550 ml  Net -1190 ml   Filed Weights   12/10/20 1215 12/12/20 1226  Weight: 83.5 kg 83.1 kg  Exam:  . General: 65 y.o. year-old female well developed well nourished in no acute distress.  Alert and oriented x3.  Conversational dyspnea noted. . Cardiovascular: Regular rate and rhythm with no rubs or gallops.  No thyromegaly or JVD noted.   Marland Kitchen Respiratory: Mild diffuse wheezing bilaterally.  Positive effort. . Abdomen: Soft nontender  nondistended with normal bowel sounds x4 quadrants. . Musculoskeletal: Trace lower extremity edema bilaterally.   . Skin: No ulcerative lesions noted or rashes . Psychiatry: Mood is appropriate for condition and setting   Data Reviewed: CBC: Recent Labs  Lab 12/10/20 1313 12/11/20 0322 12/11/20 0428  WBC 11.3* 11.3*  --   HGB 12.5 12.2 11.9*  HCT 37.3 36.6 35.0*  MCV 87.4 89.1  --   PLT 357 363  --    Basic Metabolic Panel: Recent Labs  Lab 12/10/20 1313 12/11/20 0322 12/11/20 0428 12/12/20 1202  NA 137 137 138 136  K 4.3 4.2 4.3 4.3  CL 103 101  --  102  CO2 21* 21*  --  20*  GLUCOSE 160* 273*  --  237*  BUN 11 20  --  22  CREATININE 1.00 1.79*  --  1.39*  CALCIUM 10.2 9.6  --  9.6  MG  --   --   --  1.8   GFR: Estimated Creatinine Clearance: 41.4 mL/min (A) (by C-G formula based on SCr of 1.39 mg/dL (H)). Liver Function Tests: No results for input(s): AST, ALT, ALKPHOS, BILITOT, PROT, ALBUMIN in the last 168 hours. No results for input(s): LIPASE, AMYLASE in the last 168 hours. No results for input(s): AMMONIA in the last 168 hours. Coagulation Profile: No results for input(s): INR, PROTIME in the last 168 hours. Cardiac Enzymes: No results for input(s): CKTOTAL, CKMB, CKMBINDEX, TROPONINI in the last 168 hours. BNP (last 3 results) No results for input(s): PROBNP in the last 8760 hours. HbA1C: No results for input(s): HGBA1C in the last 72 hours. CBG: Recent Labs  Lab 12/11/20 1043 12/11/20 1606 12/11/20 2059 12/12/20 0613 12/12/20 1055  GLUCAP 277* 227* 328* 197* 157*   Lipid Profile: No results for input(s): CHOL, HDL, LDLCALC, TRIG, CHOLHDL, LDLDIRECT in the last 72 hours. Thyroid Function Tests: No results for input(s): TSH, T4TOTAL, FREET4, T3FREE, THYROIDAB in the last 72 hours. Anemia Panel: No results for input(s): VITAMINB12, FOLATE, FERRITIN, TIBC, IRON, RETICCTPCT in the last 72 hours. Urine analysis:    Component Value Date/Time    COLORURINE YELLOW 06/21/2020 1321   APPEARANCEUR CLEAR 06/21/2020 1321   LABSPEC 1.011 06/21/2020 1321   PHURINE 5.0 06/21/2020 1321   GLUCOSEU NEGATIVE 06/21/2020 1321   HGBUR NEGATIVE 06/21/2020 1321   BILIRUBINUR NEGATIVE 06/21/2020 1321   KETONESUR NEGATIVE 06/21/2020 1321   PROTEINUR NEGATIVE 06/21/2020 1321   UROBILINOGEN 0.2 12/06/2011 1058   NITRITE NEGATIVE 06/21/2020 1321   LEUKOCYTESUR NEGATIVE 06/21/2020 1321   Sepsis Labs: @LABRCNTIP (procalcitonin:4,lacticidven:4)  ) Recent Results (from the past 240 hour(s))  Resp Panel by RT-PCR (Flu A&B, Covid) Nasopharyngeal Swab     Status: None   Collection Time: 12/10/20 11:49 PM   Specimen: Nasopharyngeal Swab; Nasopharyngeal(NP) swabs in vial transport medium  Result Value Ref Range Status   SARS Coronavirus 2 by RT PCR NEGATIVE NEGATIVE Final    Comment: (NOTE) SARS-CoV-2 target nucleic acids are NOT DETECTED.  The SARS-CoV-2 RNA is generally detectable in upper respiratory specimens during the acute phase of infection. The lowest concentration of SARS-CoV-2 viral copies this assay can detect is 138  copies/mL. A negative result does not preclude SARS-Cov-2 infection and should not be used as the sole basis for treatment or other patient management decisions. A negative result may occur with  improper specimen collection/handling, submission of specimen other than nasopharyngeal swab, presence of viral mutation(s) within the areas targeted by this assay, and inadequate number of viral copies(<138 copies/mL). A negative result must be combined with clinical observations, patient history, and epidemiological information. The expected result is Negative.  Fact Sheet for Patients:  EntrepreneurPulse.com.au  Fact Sheet for Healthcare Providers:  IncredibleEmployment.be  This test is no t yet approved or cleared by the [place] FDA and  has been authorized for detection and/or  diagnosis of SARS-CoV-2 by FDA under an Emergency Use Authorization (EUA). This EUA will remain  in effect (meaning this test can be used) for the duration of the COVID-19 declaration under Section 564(b)(1) of the Act, 21 U.S.C.section 360bbb-3(b)(1), unless the authorization is terminated  or revoked sooner.       Influenza A by PCR NEGATIVE NEGATIVE Final   Influenza B by PCR NEGATIVE NEGATIVE Final    Comment: (NOTE) The Xpert Xpress SARS-CoV-2/FLU/RSV plus assay is intended as an aid in the diagnosis of influenza from Nasopharyngeal swab specimens and should not be used as a sole basis for treatment. Nasal washings and aspirates are unacceptable for Xpert Xpress SARS-CoV-2/FLU/RSV testing.  Fact Sheet for Patients: EntrepreneurPulse.com.au  Fact Sheet for Healthcare Providers: IncredibleEmployment.be  This test is not yet approved or cleared by the [place] FDA and has been authorized for detection and/or diagnosis of SARS-CoV-2 by FDA under an Emergency Use Authorization (EUA). This EUA will remain in effect (meaning this test can be used) for the duration of the COVID-19 declaration under Section 564(b)(1) of the Act, 21 U.S.C. section 360bbb-3(b)(1), unless the authorization is terminated or revoked.  Performed at [facility], [place] [address], [place], [place] [address]       Studies: No results found.  Scheduled Meds: . albuterol  2.5 mg Nebulization QID  . budesonide (PULMICORT) nebulizer solution  0.25 mg Nebulization BID  . buPROPion  300 mg Oral Daily  . busPIRone  10 mg Oral TID  . cholecalciferol  1,000 Units Oral Daily  . enoxaparin (LOVENOX) injection  40 mg Subcutaneous Q24H  . gabapentin  300 mg Oral TID  . gemfibrozil  600 mg Oral BID AC  . insulin aspart  0-9 Units Subcutaneous TID WC  . insulin glargine  10 Units Subcutaneous Daily  . levothyroxine  200 mcg Oral Q0600  . methylPREDNISolone  (SOLU-MEDROL) injection  60 mg Intravenous Q12H  . pantoprazole  40 mg Oral BID  . rosuvastatin  10 mg Oral QHS    Continuous Infusions:   LOS: 1 day     Kayleen Memos, MD [facility] Pager [phone]  If 7PM-7AM, please contact night-coverage [email or web address] Password [facility] 12/12/2020, 3:16 PM

## 2020-12-13 DIAGNOSIS — E1169 Type 2 diabetes mellitus with other specified complication: Secondary | ICD-10-CM

## 2020-12-13 DIAGNOSIS — E669 Obesity, unspecified: Secondary | ICD-10-CM | POA: Diagnosis not present

## 2020-12-13 LAB — BASIC METABOLIC PANEL
Anion gap: 13 (ref 5–15)
BUN: 32 mg/dL — ABNORMAL HIGH (ref 8–23)
CO2: 20 mmol/L — ABNORMAL LOW (ref 22–32)
Calcium: 9.3 mg/dL (ref 8.9–10.3)
Chloride: 99 mmol/L (ref 98–111)
Creatinine, Ser: 1.39 mg/dL — ABNORMAL HIGH (ref 0.44–1.00)
GFR, Estimated: 43 mL/min — ABNORMAL LOW (ref >60)
Glucose, Bld: 414 mg/dL — ABNORMAL HIGH (ref 70–99)
Potassium: 4.2 mmol/L (ref 3.5–5.1)
Sodium: 132 mmol/L — ABNORMAL LOW (ref 135–145)

## 2020-12-13 LAB — GLUCOSE, CAPILLARY
Glucose-Capillary: 238 mg/dL — ABNORMAL HIGH (ref 70–99)
Glucose-Capillary: 224 mg/dL — ABNORMAL HIGH (ref 70–99)
Glucose-Capillary: 132 mg/dL — ABNORMAL HIGH (ref 70–99)
Glucose-Capillary: 267 mg/dL — ABNORMAL HIGH (ref 70–99)

## 2020-12-13 LAB — PHOSPHORUS: Phosphorus: 4.2 mg/dL (ref 2.5–4.6)

## 2020-12-13 LAB — MAGNESIUM: Magnesium: 1.8 mg/dL (ref 1.7–2.4)

## 2020-12-13 MED ORDER — PANTOPRAZOLE SODIUM 40 MG PO TBEC
80.0000 mg | DELAYED_RELEASE_TABLET | Freq: Two times a day (BID) | ORAL | Status: DC
  Administered 2020-12-13 – 2020-12-17 (×8): 80 mg via ORAL
  Filled 2020-12-13 (×8): qty 2

## 2020-12-13 MED ORDER — MONTELUKAST SODIUM 10 MG PO TABS
10.0000 mg | ORAL_TABLET | Freq: Every day | ORAL | Status: DC
  Administered 2020-12-13 – 2020-12-16 (×4): 10 mg via ORAL
  Filled 2020-12-13 (×4): qty 1

## 2020-12-13 MED ORDER — FAMOTIDINE 20 MG PO TABS
40.0000 mg | ORAL_TABLET | Freq: Every day | ORAL | Status: DC
  Administered 2020-12-13 – 2020-12-16 (×4): 40 mg via ORAL
  Filled 2020-12-13 (×4): qty 2

## 2020-12-13 MED ORDER — INSULIN ASPART 100 UNIT/ML ~~LOC~~ SOLN
8.0000 [IU] | Freq: Three times a day (TID) | SUBCUTANEOUS | Status: DC
  Administered 2020-12-13 – 2020-12-16 (×8): 8 [IU] via SUBCUTANEOUS

## 2020-12-13 MED ORDER — INSULIN GLARGINE 100 UNIT/ML ~~LOC~~ SOLN
13.0000 [IU] | Freq: Two times a day (BID) | SUBCUTANEOUS | Status: DC
  Administered 2020-12-13 – 2020-12-16 (×6): 13 [IU] via SUBCUTANEOUS
  Filled 2020-12-13 (×7): qty 0.13

## 2020-12-13 MED ORDER — FLUTICASONE PROPIONATE 50 MCG/ACT NA SUSP
2.0000 | Freq: Two times a day (BID) | NASAL | Status: DC
  Administered 2020-12-13 – 2020-12-17 (×8): 2 via NASAL
  Filled 2020-12-13: qty 16

## 2020-12-13 MED ORDER — METHYLPREDNISOLONE SODIUM SUCC 40 MG IJ SOLR
40.0000 mg | Freq: Two times a day (BID) | INTRAMUSCULAR | Status: AC
  Administered 2020-12-13 – 2020-12-14 (×2): 40 mg via INTRAVENOUS
  Filled 2020-12-13 (×2): qty 1

## 2020-12-13 NOTE — TOC Initial Note (Signed)
Transition of Care [facility]) - Initial/Assessment Note    Patient Details  Name: Chelsea Kerr MRN: [id number] Date of Birth: 1956-01-21  Transition of Care [facility]) CM/SW Contact:    Marilu Favre, RN Phone Number: 12/13/2020, 3:57 PM  Clinical Narrative:                 Patient from home with sister. Patient has children who are available to assist her and her 64 year old grand daughter is also coming to stay with her.   Discussed PT recommendation for HHPT and 24 hour supervision. Patient states she has 24 hour supervision and at this time she does not not feel that she needs HHPT. NCM discussed importance of HHPT example safety evaluation, gait and balance training etc. Patient voiced understanding but at this time does not fell that she needs HHPT. If patient changes her mind she will call PCP office to arrange.   Patient already has a walker and 3 in 1 at home.    Expected Discharge Plan: Home/Self Care Barriers to Discharge: Continued Medical Work up   Patient Goals and CMS Choice Patient states their goals for this hospitalization and ongoing recovery are:: to return to home CMS Medicare.gov Compare Post Acute Care list provided to:: Patient Choice offered to / list presented to : Patient  Expected Discharge Plan and Services Expected Discharge Plan: Home/Self Care   Discharge Planning Services: CM Consult Post Acute Care Choice: [place] arrangements for the past 2 months: Single Family Home                 DME Arranged: N/A DME Agency: NA       HH Arranged: Refused HH          Prior Living Arrangements/Services Living arrangements for the past 2 months: Single Family Home Lives with:: Siblings Patient language and need for interpreter reviewed:: Yes Do you feel safe going back to the place where you live?: Yes      Need for Family Participation in Patient Care: Yes (Comment) Care giver support system in place?: Yes (comment) Current home services:  DME Criminal Activity/Legal Involvement Pertinent to Current Situation/Hospitalization: No - Comment as needed  Activities of Daily Living Home Assistive Devices/Equipment: Blood pressure cuff,Eyeglasses ADL Screening (condition at time of admission) Patient's cognitive ability adequate to safely complete daily activities?: Yes Is the patient deaf or have difficulty hearing?: No Does the patient have difficulty seeing, even when wearing glasses/contacts?: No Does the patient have difficulty concentrating, remembering, or making decisions?: No Patient able to express need for assistance with ADLs?: Yes Does the patient have difficulty dressing or bathing?: No Independently performs ADLs?: Yes (appropriate for developmental age) Does the patient have difficulty walking or climbing stairs?: No Weakness of Legs: Both Weakness of Arms/Hands: None  Permission Sought/Granted   Permission granted to share information with : No              Emotional Assessment Appearance:: Appears stated age Attitude/Demeanor/Rapport: Engaged Affect (typically observed): Accepting Orientation: : Oriented to Self,Oriented to Place,Oriented to  Time,Oriented to Situation Alcohol / Substance Use: Not Applicable Psych Involvement: No (comment)  Admission diagnosis:  Moderate persistent asthma with exacerbation [J45.41] Asthma exacerbation [J45.901] Patient Active Problem List   Diagnosis Date Noted  . Diabetes mellitus type 2 in obese ([place]) 12/11/2020  . Asthma exacerbation 12/10/2020  . COVID-19 06/21/2020  . Radiculopathy, lumbar region 12/07/2011   PCP:  Isaias Sakai, DO Pharmacy:  [id number] - [place], [place] [address] Phone: [phone] Fax: [phone]     Social Determinants of Health (SDOH) Interventions    Readmission Risk Interventions No flowsheet data found.

## 2020-12-13 NOTE — Progress Notes (Signed)
Nutrition Brief Note  Patient identified on the Malnutrition Screening Tool (MST) Report  Wt Readings from Last 15 Encounters:  12/12/20 83.1 kg  06/21/20 89.8 kg  12/06/11 93.6 kg   Current diet order is heart healthy/carbohydrate modifed, patient is consuming approximately 75-100% of meals at this time. Pt reports having a good appetite currently and PTA at home with no difficulties. Pt reports gradual weight loss, however has been intentional due to modification of lifestyle changes and recent fluid status. Labs and medications reviewed.   No nutrition interventions warranted at this time. If nutrition issues arise, please consult RD.   Corrin Parker, MS, RD, LDN RD pager number/after hours weekend pager number on Amion.

## 2020-12-13 NOTE — Evaluation (Signed)
Physical Therapy Evaluation Patient Details Name: Chelsea Kerr MRN: [id number] DOB: September 17, 1956 Today's Date: 12/13/2020   History of Present Illness  65yo female c/o SOB. CTA negative for PE, Covid test negative. Admitted with acute asthma exacerbation. PMH DM, COPD, asthma, anxiety  Clinical Impression   Patient received in bed, very pleasant and cooperative with therapy. Reports significant dizziness when up on her feet- orthostatics taken as follows:  Supine 133/70 HR 75  Sitting 131/67 HR 79  Standing 148/71 HR 90  Standing x3 minutes 148/79 HR 94  Otherwise practiced functional transfers and gait in her room only due to fatigue levels after performing orthostatic testing. Has strong posterior bias once on her feet and needs at least MinA to maintain balance both statically and dynamically, fatigues quickly with gait. SpO2 95-97% on room air, HR WNL. Suspect she might possibly be experiencing some vertigo potentially contributing to her balance impairment and feelings of dizziness- will plan for formal assessment next therapy session. Left up in recliner with all needs met, nursing staff aware of patient status. Might benefit from SNF but prefers return home with 24/7A from family.     Follow Up Recommendations Home health PT;Supervision/Assistance - 24 hour    Equipment Recommendations  Rolling walker with 5" wheels;3in1 (PT)    Recommendations for Other Services       Precautions / Restrictions Precautions Precautions: Fall;Other (comment) Precaution Comments: posterior bias, possible vertigo, watch sats Restrictions Weight Bearing Restrictions: No      Mobility  Bed Mobility Overal bed mobility: Needs Assistance Bed Mobility: Supine to Sit     Supine to sit: HOB elevated;Min assist     General bed mobility comments: MinA to anchor and spin hips around to EOB    Transfers Overall transfer level: Needs assistance Equipment used: Rolling walker (2  wheeled) Transfers: Sit to/from Stand Sit to Stand: Min assist         General transfer comment: MinA to boost to upright standing, moderate posterior lean/bias once up on her feet and becomes tremulous very quickly  Ambulation/Gait Ambulation/Gait assistance: Min assist Gait Distance (Feet): 10 Feet Assistive device: Rolling walker (2 wheeled) Gait Pattern/deviations: Step-through pattern;Decreased step length - right;Decreased step length - left;Decreased stride length;Trendelenburg;Leaning posteriorly;Narrow base of support Gait velocity: decreased   General Gait Details: slow and unsteady even with RW, has moderate posterior bias even with RW but able to maintain balance with MinA from therapist. Fatigues easily. SpO2 95-97% on RA.  Stairs            Wheelchair Mobility    Modified Rankin (Stroke Patients Only)       Balance Overall balance assessment: Needs assistance Sitting-balance support: Bilateral upper extremity supported;Feet supported Sitting balance-Leahy Scale: Good Sitting balance - Comments: S for safety   Standing balance support: Bilateral upper extremity supported;During functional activity Standing balance-Leahy Scale: Poor Standing balance comment: MinA to maintain balance with BUE support                             Pertinent Vitals/Pain Pain Assessment: Faces Faces Pain Scale: Hurts little more Pain Location: HA Pain Descriptors / Indicators: Aching;Headache Pain Intervention(s): Limited activity within patient's tolerance;Monitored during session;Repositioned    Home Living Family/patient expects to be discharged to:: Private residence Living Arrangements: Other relatives (sister) Available Help at Discharge: Family;Available 24 hours/day Type of Home: House Home Access: Ramped entrance     Home Layout: One level  Home Equipment: Walker - 2 wheels;Crutches;Cane - single point;Shower seat;Grab bars - tub/shower       Prior Function Level of Independence: Independent with assistive device(s) (cane unless she is feeling sick)               Hand Dominance        Extremity/Trunk Assessment   Upper Extremity Assessment Upper Extremity Assessment: Generalized weakness    Lower Extremity Assessment Lower Extremity Assessment: Generalized weakness    Cervical / Trunk Assessment Cervical / Trunk Assessment: Kyphotic  Communication   Communication: No difficulties  Cognition Arousal/Alertness: Awake/alert Behavior During Therapy: WFL for tasks assessed/performed;Anxious Overall Cognitive Status: Within Functional Limits for tasks assessed                                        General Comments General comments (skin integrity, edema, etc.): SpO2 95-97% on RA    Exercises     Assessment/Plan    PT Assessment Patient needs continued PT services  PT Problem List Decreased strength;Decreased knowledge of use of DME;Obesity;Decreased activity tolerance;Decreased safety awareness;Decreased balance;Decreased mobility;Decreased coordination;Other (comment) (possible vertigo)       PT Treatment Interventions DME instruction;Balance training;Gait training;Neuromuscular re-education;Stair training;Functional mobility training;Patient/family education;Therapeutic activities;Therapeutic exercise;Other (comment) (vestibular interventions)    PT Goals (Current goals can be found in the Care Plan section)  Acute Rehab PT Goals Patient Stated Goal: go home PT Goal Formulation: With patient Time For Goal Achievement: 12/27/20 Potential to Achieve Goals: Fair    Frequency Min 3X/week   Barriers to discharge        Co-evaluation               AM-PAC PT "6 Clicks" Mobility  Outcome Measure Help needed turning from your back to your side while in a flat bed without using bedrails?: A Little Help needed moving from lying on your back to sitting on the side of a flat bed  without using bedrails?: A Little Help needed moving to and from a bed to a chair (including a wheelchair)?: A Little Help needed standing up from a chair using your arms (e.g., wheelchair or bedside chair)?: A Little Help needed to walk in hospital room?: A Little Help needed climbing 3-5 steps with a railing? : A Lot 6 Click Score: 17    End of Session Equipment Utilized During Treatment: Gait belt Activity Tolerance: Patient limited by fatigue;Patient tolerated treatment well Patient left: in chair;with call bell/phone within reach Nurse Communication: Mobility status PT Visit Diagnosis: Unsteadiness on feet (R26.81);Difficulty in walking, not elsewhere classified (R26.2);Muscle weakness (generalized) (M62.81);Dizziness and giddiness (R42)    Time: 5916-3846 PT Time Calculation (min) (ACUTE ONLY): 38 min   Charges:   PT Evaluation $PT Eval Moderate Complexity: 1 Mod PT Treatments $Gait Training: 8-22 mins $Therapeutic Activity: 8-22 mins        Windell Norfolk, DPT, PN1   Supplemental Physical Therapist [place]    Pager [phone] Acute Rehab Office [phone]

## 2020-12-13 NOTE — Progress Notes (Signed)
PROGRESS NOTE  Chelsea Kerr [id number] DOB: December 23, 1955 DOA: 12/10/2020 PCP: Isaias Sakai, DO  HPI/Recap of past 24 hours:  Chelsea Kerr a 65 y.o.femalewithhistory of asthma, type II diabetes mellitus, hyperlipidemia, hypothyroidism and chronic anxiety/depression presents to the ER with complaint of shortness of breath. Patient has been having shortness of breath off and on for last 1 week with some semi-productive cough. Denies fever chills or chest pain. Patient states she has been compliant with her inhaler but still wheezing with dyspnea.    ED Course:In the ER patient was found to be diffusely wheezing CT angiogram of the chest was negative for PE but does show bronchial wall thickening. Patient was started on nebulizer therapy after Covid test was negative. Admitted for asthma exacerbation. Labs are largely unremarkable. BNP and high sensitive troponins were negative. EKG shows normal sinus rhythm.  Hospital course complicated by persistent dyspnea with minimal exertion despite current therapies.  She is currently on IV Solu-Medrol 60 mg twice daily, scheduled nebulizers, Pulmicort, Singulair, p.o. Lasix 40 mg twice daily.  Pulmonary consulted, appreciate assistance.   12/13/20: Patient was seen and examined at her bedside.  She continues to complain of chest tightness and dyspnea with minimal exertion.  She is concerned about going home, decompensate, and having to return right away.  Assessment/Plan: Principal Problem:   Asthma exacerbation Active Problems:   Diabetes mellitus type 2 in obese ([place])   Acute asthma exacerbation Unclear triggers Chest x-ray done on 12/10/2020 personally reviewed showing no lobular infiltrates or increase in pulmonary vascularity to suggest pulmonary edema, no active cardiopulmonary disease. She was started on IV Solu-Medrol in the ED, continued. She is currently on IV Solu-Medrol 60 mg twice daily, scheduled nebulizers,  Pulmicort, Singulair, p.o. Lasix 40 mg twice daily.   Pulmonary consulted, appreciate assistance. Has received IV Lasix intermittently O2 saturations currently stable, 96% on room air.  Type 2 diabetes with hyperglycemia, exacerbated by high doses of IV Solu-Medrol. Hemoglobin A1c 6.7 on 12/12/2020 Increase Lantus to 13 units twice daily Start NovoLog 8 unit 3 times daily Continue SSI scale. Appreciate diabetes coordinator's assistance.  Diabetic neuropathy Continue home regimen  Hypothyroidism Continue home levothyroxine  Chronic anxiety/depression Continue home regimen  GERD Continue home PPI  Hyperlipidemia Continue home Crestor  History of COVID-19 pneumonia in July 2021 Positive COVID-19 test on 06/21/2020 was admitted and treated for COVID-19 pneumonia. Repeated COVID-19 screening test on 12/10/2020 -.    Code Status: Full code.  Family Communication: None at bedside  Disposition Plan: Home likely on 12/14/2020 or when pulmonary signs off.  Consultants:  Pulmonary  Procedures:  2D echo  Antimicrobials:  None  DVT prophylaxis:  Subcu Lovenox daily   Status is: Inpatient   Dispo:  Patient From: Home  Planned Disposition: Home  Expected discharge date: 12/14/20  Medically stable for discharge: No, ongoing management of acute asthma exacerbation.         Objective: Vitals:   12/12/20 1801 12/12/20 2336 12/13/20 0400 12/13/20 1236  BP: (!) 142/71 (!) 141/71 130/67 133/70  Pulse: 75 77 92 75  Resp: 19 18 20 16   Temp: 98.5 F (36.9 C) 98.5 F (36.9 C) 98.3 F (36.8 C) 97.9 F (36.6 C)  TempSrc: Oral Oral Oral Oral  SpO2: 98% 96% 98% 96%  Weight:      Height:        Intake/Output Summary (Last 24 hours) at 12/13/2020 1253 Last data filed at 12/13/2020 0700 Gross per 24 hour  Intake 320 ml  Output 3200 ml  Net -2880 ml   Filed Weights   12/10/20 1215 12/12/20 1226  Weight: 83.5 kg 83.1 kg    Exam:  . General: 65 y.o. year-old  female well-developed well-nourished in no acute distress.  Conversational dyspnea noted.  Alert and oriented x3. . Cardiovascular: Regular rate and rhythm no rubs or gallops. Marland Kitchen Respiratory: Clear to auscultation no wheeze or rales.  Good inspiratory effort. . Abdomen: Obese nontender normal bowel sounds present.  . Musculoskeletal: No lower extremity edema bilaterally.   . Skin: No ulcerative lesions noted.   Marland Kitchen Psychiatry: Mood is appropriate for condition and setting.   Data Reviewed: CBC: Recent Labs  Lab 12/10/20 1313 12/11/20 0322 12/11/20 0428  WBC 11.3* 11.3*  --   HGB 12.5 12.2 11.9*  HCT 37.3 36.6 35.0*  MCV 87.4 89.1  --   PLT 357 363  --    Basic Metabolic Panel: Recent Labs  Lab 12/10/20 1313 12/11/20 0322 12/11/20 0428 12/12/20 1202 12/13/20 0758  NA 137 137 138 136 132*  K 4.3 4.2 4.3 4.3 4.2  CL 103 101  --  102 99  CO2 21* 21*  --  20* 20*  GLUCOSE 160* 273*  --  237* 414*  BUN 11 20  --  22 32*  CREATININE 1.00 1.79*  --  1.39* 1.39*  CALCIUM 10.2 9.6  --  9.6 9.3  MG  --   --   --  1.8 1.8  PHOS  --   --   --   --  4.2   GFR: Estimated Creatinine Clearance: 41.4 mL/min (A) (by C-G formula based on SCr of 1.39 mg/dL (H)). Liver Function Tests: No results for input(s): AST, ALT, ALKPHOS, BILITOT, PROT, ALBUMIN in the last 168 hours. No results for input(s): LIPASE, AMYLASE in the last 168 hours. No results for input(s): AMMONIA in the last 168 hours. Coagulation Profile: No results for input(s): INR, PROTIME in the last 168 hours. Cardiac Enzymes: No results for input(s): CKTOTAL, CKMB, CKMBINDEX, TROPONINI in the last 168 hours. BNP (last 3 results) No results for input(s): PROBNP in the last 8760 hours. HbA1C: Recent Labs    12/12/20 1202  HGBA1C 6.7*   CBG: Recent Labs  Lab 12/12/20 1055 12/12/20 1624 12/12/20 2104 12/13/20 0605 12/13/20 1107  GLUCAP 157* 229* 194* 238* 224*   Lipid Profile: No results for input(s): CHOL, HDL,  LDLCALC, TRIG, CHOLHDL, LDLDIRECT in the last 72 hours. Thyroid Function Tests: No results for input(s): TSH, T4TOTAL, FREET4, T3FREE, THYROIDAB in the last 72 hours. Anemia Panel: No results for input(s): VITAMINB12, FOLATE, FERRITIN, TIBC, IRON, RETICCTPCT in the last 72 hours. Urine analysis:    Component Value Date/Time   COLORURINE YELLOW 06/21/2020 1321   APPEARANCEUR CLEAR 06/21/2020 1321   LABSPEC 1.011 06/21/2020 1321   PHURINE 5.0 06/21/2020 1321   GLUCOSEU NEGATIVE 06/21/2020 1321   HGBUR NEGATIVE 06/21/2020 1321   BILIRUBINUR NEGATIVE 06/21/2020 1321   KETONESUR NEGATIVE 06/21/2020 1321   PROTEINUR NEGATIVE 06/21/2020 1321   UROBILINOGEN 0.2 12/06/2011 1058   NITRITE NEGATIVE 06/21/2020 1321   LEUKOCYTESUR NEGATIVE 06/21/2020 1321   Sepsis Labs: @LABRCNTIP (procalcitonin:4,lacticidven:4)  ) Recent Results (from the past 240 hour(s))  Resp Panel by RT-PCR (Flu A&B, Covid) Nasopharyngeal Swab     Status: None   Collection Time: 12/10/20 11:49 PM   Specimen: Nasopharyngeal Swab; Nasopharyngeal(NP) swabs in vial transport medium  Result Value Ref Range Status   SARS Coronavirus  2 by RT PCR NEGATIVE NEGATIVE Final    Comment: (NOTE) SARS-CoV-2 target nucleic acids are NOT DETECTED.  The SARS-CoV-2 RNA is generally detectable in upper respiratory specimens during the acute phase of infection. The lowest concentration of SARS-CoV-2 viral copies this assay can detect is 138 copies/mL. A negative result does not preclude SARS-Cov-2 infection and should not be used as the sole basis for treatment or other patient management decisions. A negative result may occur with  improper specimen collection/handling, submission of specimen other than nasopharyngeal swab, presence of viral mutation(s) within the areas targeted by this assay, and inadequate number of viral copies(<138 copies/mL). A negative result must be combined with clinical observations, patient history, and  epidemiological information. The expected result is Negative.  Fact Sheet for Patients:  EntrepreneurPulse.com.au  Fact Sheet for Healthcare Providers:  IncredibleEmployment.be  This test is no t yet approved or cleared by the [place] FDA and  has been authorized for detection and/or diagnosis of SARS-CoV-2 by FDA under an Emergency Use Authorization (EUA). This EUA will remain  in effect (meaning this test can be used) for the duration of the COVID-19 declaration under Section 564(b)(1) of the Act, 21 U.S.C.section 360bbb-3(b)(1), unless the authorization is terminated  or revoked sooner.       Influenza A by PCR NEGATIVE NEGATIVE Final   Influenza B by PCR NEGATIVE NEGATIVE Final    Comment: (NOTE) The Xpert Xpress SARS-CoV-2/FLU/RSV plus assay is intended as an aid in the diagnosis of influenza from Nasopharyngeal swab specimens and should not be used as a sole basis for treatment. Nasal washings and aspirates are unacceptable for Xpert Xpress SARS-CoV-2/FLU/RSV testing.  Fact Sheet for Patients: EntrepreneurPulse.com.au  Fact Sheet for Healthcare Providers: IncredibleEmployment.be  This test is not yet approved or cleared by the [place] FDA and has been authorized for detection and/or diagnosis of SARS-CoV-2 by FDA under an Emergency Use Authorization (EUA). This EUA will remain in effect (meaning this test can be used) for the duration of the COVID-19 declaration under Section 564(b)(1) of the Act, 21 U.S.C. section 360bbb-3(b)(1), unless the authorization is terminated or revoked.  Performed at [facility], [place] [address], [place], [place] [address]       Studies: No results found.  Scheduled Meds: . albuterol  2.5 mg Nebulization QID  . budesonide (PULMICORT) nebulizer solution  0.25 mg Nebulization BID  . buPROPion  300 mg Oral Daily  . busPIRone  10 mg Oral TID  .  cholecalciferol  1,000 Units Oral Daily  . enoxaparin (LOVENOX) injection  40 mg Subcutaneous Q24H  . gabapentin  300 mg Oral TID  . gemfibrozil  600 mg Oral BID AC  . insulin aspart  0-9 Units Subcutaneous TID WC  . insulin aspart  8 Units Subcutaneous TID WC  . insulin glargine  13 Units Subcutaneous BID  . levothyroxine  200 mcg Oral Q0600  . methylPREDNISolone (SOLU-MEDROL) injection  60 mg Intravenous Q12H  . montelukast  10 mg Oral QHS  . pantoprazole  40 mg Oral BID  . rosuvastatin  10 mg Oral QHS    Continuous Infusions:   LOS: 2 days     Kayleen Memos, MD [facility] Pager [phone]  If 7PM-7AM, please contact night-coverage [email or web address] Password [facility] 12/13/2020, 12:53 PM

## 2020-12-13 NOTE — Progress Notes (Signed)
Inpatient Diabetes Program Recommendations  AACE/ADA: New Consensus Statement on Inpatient Glycemic Control (2015)  Target Ranges:  Prepandial:   less than 140 mg/dL      Peak postprandial:   less than 180 mg/dL (1-2 hours)      Critically ill patients:  140 - 180 mg/dL   Results for Chelsea Kerr, Chelsea Kerr (MRN [id number]) as of 12/13/2020 11:43  Ref. Range 12/12/2020 06:13 12/12/2020 10:55 12/12/2020 16:24 12/12/2020 21:04  Glucose-Capillary Latest Ref Range: 70 - 99 mg/dL 197 (H)  2 units NOVOLOG  157 (H)  2 units NOVOLOG  10 units LANTUS  229 (H)  8 units NOVOLOG  194 (H)     10 units LANTUS   Results for Chelsea Kerr, Chelsea Kerr (MRN [id number]) as of 12/13/2020 11:43  Ref. Range 12/13/2020 06:05 12/13/2020 11:07  Glucose-Capillary Latest Ref Range: 70 - 99 mg/dL 238 (H)  8 units NOVOLOG  10 units LANTUS  224 (H)  8 units NOVOLOG    Results for Chelsea Kerr, Chelsea Kerr (MRN [id number]) as of 12/13/2020 11:43  Ref. Range 12/12/2020 12:02  Hemoglobin A1C Latest Ref Range: 4.8 - 5.6 % 6.7 (H)   Admit with: Acute asthma exacerbation  History: DM  Home DM Meds: Amaryl 2 mg Daily  Current Orders: Lantus 10 units BID      Novolog Sensitive Correction Scale/ SSI (0-9 units) TID AC      Novolog 5 units TID    Solumedrol 60 mg BID  MD- Note Lantus increased to 10 units BID yesterday PM and Novolog Meal Coverage also started yesterday PM with Dinner dose  CBGs remain 238-224 today  Please consider while pt remains on IV Steroids:  1. Increase Lantus to 13 units BID (0.3 units/kg)  2. Increase Novolog Meal Coverage to 8 units TID with meals    --Will follow patient during hospitalization--  Wyn Quaker RN, MSN, CDE Diabetes Coordinator Inpatient Glycemic Control Team Team Pager: [phone] (8a-5p)
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
## 2020-12-14 ENCOUNTER — Inpatient Hospital Stay (HOSPITAL_COMMUNITY): Payer: Medicare Other

## 2020-12-14 DIAGNOSIS — J4541 Moderate persistent asthma with (acute) exacerbation: Secondary | ICD-10-CM | POA: Diagnosis not present

## 2020-12-14 LAB — GLUCOSE, CAPILLARY
Glucose-Capillary: 149 mg/dL — ABNORMAL HIGH (ref 70–99)
Glucose-Capillary: 232 mg/dL — ABNORMAL HIGH (ref 70–99)
Glucose-Capillary: 116 mg/dL — ABNORMAL HIGH (ref 70–99)
Glucose-Capillary: 115 mg/dL — ABNORMAL HIGH (ref 70–99)

## 2020-12-14 MED ORDER — ALBUTEROL SULFATE (2.5 MG/3ML) 0.083% IN NEBU
2.5000 mg | INHALATION_SOLUTION | Freq: Two times a day (BID) | RESPIRATORY_TRACT | Status: DC
  Administered 2020-12-15 – 2020-12-17 (×4): 2.5 mg via RESPIRATORY_TRACT
  Filled 2020-12-14 (×4): qty 3

## 2020-12-14 MED ORDER — MECLIZINE HCL 25 MG PO TABS
25.0000 mg | ORAL_TABLET | Freq: Three times a day (TID) | ORAL | Status: DC
  Administered 2020-12-14 – 2020-12-17 (×8): 25 mg via ORAL
  Filled 2020-12-14 (×9): qty 1

## 2020-12-14 NOTE — Progress Notes (Addendum)
   NAME:  COLENA KETTERMAN, MRN:  [id number], DOB:  01/13/1956, LOS: 3 ADMISSION DATE:  12/10/2020, CONSULTATION DATE: 1/10 REFERRING MD: Nevada Crane, CHIEF COMPLAINT: Asthmatic exacerbation not responding to therapy  Brief History:  65 year old female who carries a history of asthma admitted 1/7 with working diagnosis of asthmatic exacerbation.  Treated in the usual fashion including scheduled bronchodilators, scheduled inhaled steroids and systemic steroids.  Pulmonary asked to see on 1/10 as patient not responding to treatment   Past Medical History:  Asthma, type 2 diabetes, hyperlipidemia, hypothyroidism, chronic anxiety and depression. GERD, esophageal stricture requiring dilation, hiatal hernia  Significant Hospital Events:  1/7 admitted 1/8 steroids increased  1/9 steroids increased 1/10 pulmonary consulted   Consults:  Pulmonary consulted 1/10  Procedures:    Significant Diagnostic Tests:  1/7 CT chest negative for pulmonary emboli, diffuse mild bronchial wall thickening 1/11 esophagram: no sig stricture, dyfxn or reflux  Micro Data:  Respiratory viral panel 1/7 -  Antimicrobials:  none Interim History / Subjective:  Doesn't feel better   Objective   Blood pressure 130/68, pulse 63, temperature (Abnormal) 97.5 F (36.4 C), temperature source Oral, resp. rate 18, height 5\' 2"  (1.575 m), weight 81.1 kg, SpO2 98 %.        Intake/Output Summary (Last 24 hours) at 12/14/2020 1055 Last data filed at 12/14/2020 0900 Gross per 24 hour  Intake no documentation  Output 2300 ml  Net -2300 ml   Filed Weights   12/10/20 1215 12/12/20 1226 12/14/20 0500  Weight: 83.5 kg 83.1 kg 81.1 kg    Examination: General anxious 65 year old female. Still has breathy voice quality and speaking in 5-6 word phrases.  HENT NCAT no JVD. Turbulent UAW noises but no wheezing Pulm clear Card rrr abd soft not tender Ext warm and dry  Neuro intact  Resolved Hospital Problem list     Assessment  & Plan:   Acute on chronic dyspnea  Asthmatic exacerbation   Cough  Gastric reflux Sinus congestion Laryngopharyngeal reflux disease H/o gastric stricture w/ prior dilation  H/o Hiatal hernia    Acute on chronic  dyspnea: multifactorial: asthmatic exacerbation and to a greater extent laryngopharyngeal reflux disease and sinus congestion  -suprisingly her esophagram did not significant dysfxn which seems fairly out of proportion to her symptom burden. I am a little perplexed as to what is the culprit of her on-going dyspnea as I'm not convinced this is asthma or bronchospasm. Need to widen our scope a little. No real fibrosis on CT imaging. Esophagram not really conclusive. Symptoms today: not really better. She did report a lot of coughing during the esophagram. This was not really captured in the report.   Plan Continue maximum PPI and HS H2B Cont steroids at current dosing Cont reflux precautions Cont BDs and ICS Will ask SLP to see her-->will need MBS Get ECHO Repeat cxr today  Need to get PFTs as out-pt       Erick Colace ACNP-BC [place] Pager # [phone] OR # [phone] if no answer

## 2020-12-14 NOTE — Progress Notes (Signed)
Physical Therapy Treatment Patient Details Name: Chelsea Kerr MRN: [id number] DOB: March 01, 1956 Today's Date: 12/14/2020    History of Present Illness 65yo female c/o SOB. CTA negative for PE, Covid test negative. Admitted with acute asthma exacerbation. PMH DM, COPD, asthma, anxiety    PT Comments    Patient received in bed, pleasant but perseverating on "peter thinks its my heart" and a bit unclear about what she understands to be her POC moving forward. Able to convince her to participate in brief vestibular exam this morning- does show signs of severe central vestibular impairment with targeted testing today. Discussed meaning of all exam findings as well as vestibular exercises that she can work on at bed level. Encouraged participation with HHPT and especially vestibular follow up after DC so we can work to reduce her fall risk at home. Left in bed with all needs met, bed alarm active.     Follow Up Recommendations  Home health PT;Supervision/Assistance - 24 hour;Other (comment) (vestibular follow up)     Equipment Recommendations  Rolling walker with 5" wheels;3in1 (PT)    Recommendations for Other Services       Precautions / Restrictions Precautions Precautions: Fall;Other (comment) Precaution Comments: posterior bias, possible vertigo, watch sats Restrictions Weight Bearing Restrictions: No    Mobility  Bed Mobility Overal bed mobility: Needs Assistance Bed Mobility: Supine to Sit     Supine to sit: HOB elevated;Min guard     General bed mobility comments: HOB mildly elevated, min guard for safety and increased time  Transfers                 General transfer comment: deferred- vestibular exam  Ambulation/Gait             General Gait Details: deferred- vestibular exam   Stairs             Wheelchair Mobility    Modified Rankin (Stroke Patients Only)       Balance Overall balance assessment: Needs assistance Sitting-balance  support: Bilateral upper extremity supported;Feet supported Sitting balance-Leahy Scale: Good Sitting balance - Comments: S for safety                                    Cognition Arousal/Alertness: Awake/alert Behavior During Therapy: WFL for tasks assessed/performed;Anxious Overall Cognitive Status: Within Functional Limits for tasks assessed                                 General Comments: unsure of mild STM/processing deficit vs just having a hard time following medical information? Not great at describing symptoms or teachback of information      Exercises      General Comments General comments (skin integrity, edema, etc.): SpO2 98% on RA      Pertinent Vitals/Pain Pain Assessment: Faces Faces Pain Scale: Hurts a little bit Pain Location: generalized Pain Descriptors / Indicators: Aching;Headache Pain Intervention(s): Limited activity within patient's tolerance;Monitored during session    Home Living                      Prior Function            PT Goals (current goals can now be found in the care plan section) Acute Rehab PT Goals Patient Stated Goal: go home PT Goal Formulation: With patient Time For Goal  Achievement: 12/27/20 Potential to Achieve Goals: Fair Progress towards PT goals: Progressing toward goals    Frequency    Min 3X/week      PT Plan Discharge plan needs to be updated    Co-evaluation              AM-PAC PT "6 Clicks" Mobility   Outcome Measure  Help needed turning from your back to your side while in a flat bed without using bedrails?: A Little Help needed moving from lying on your back to sitting on the side of a flat bed without using bedrails?: A Little Help needed moving to and from a bed to a chair (including a wheelchair)?: A Little Help needed standing up from a chair using your arms (e.g., wheelchair or bedside chair)?: A Little Help needed to walk in hospital room?: A  Little Help needed climbing 3-5 steps with a railing? : A Lot 6 Click Score: 17    End of Session   Activity Tolerance: Patient tolerated treatment well Patient left: in bed;with call bell/phone within reach;with bed alarm set Nurse Communication: Mobility status PT Visit Diagnosis: Unsteadiness on feet (R26.81);Difficulty in walking, not elsewhere classified (R26.2);Muscle weakness (generalized) (M62.81);Dizziness and giddiness (R42)     Time: 0165-5374 PT Time Calculation (min) (ACUTE ONLY): 25 min  Charges:  $Neuromuscular Re-education: 8-22 mins $Self Care/Home Management: 8-22                     Windell Norfolk, DPT, PN1   Supplemental Physical Therapist [place]    Pager [phone] Acute Rehab Office [phone]

## 2020-12-14 NOTE — Progress Notes (Signed)
PROGRESS NOTE  Chelsea Kerr IRS:[id number] DOB: 21-Aug-1956 DOA: 12/10/2020 PCP: Isaias Sakai, DO  HPI/Recap of past 24 hours:  Chelsea Kerr a 65 y.o.femalewithhistory of asthma, type II diabetes mellitus, hyperlipidemia, hypothyroidism and chronic anxiety/depression presents to the ER with complaint of shortness of breath. Patient has been having shortness of breath off and on for last 1 week with some semi-productive cough. Denies fever chills or chest pain. Patient states she has been compliant with her inhaler but still wheezing with dyspnea.    ED Course:In the ER patient was found to be diffusely wheezing CT angiogram of the chest was negative for PE but does show bronchial wall thickening. Patient was started on nebulizer therapy after Covid test was negative. Admitted for asthma exacerbation. Labs are largely unremarkable. BNP and high sensitive troponins were negative. EKG shows normal sinus rhythm.  Hospital course complicated by persistent dyspnea with minimal exertion despite current therapies.  She is currently on IV Solu-Medrol 60 mg twice daily, scheduled nebulizers, Pulmicort, Singulair, p.o. Lasix 40 mg twice daily.  Pulmonary consulted, appreciate assistance.   12/14/20: Patient was seen and examined at her bedside.  She reports she feels slightly better today than she did yesterday.  She is hopeful that her condition will continue to improve prior to discharge.  She is very grateful for PCCM's visit.  We highly appreciate PCCM recommendations.  Assessment/Plan: Principal Problem:   Asthma exacerbation Active Problems:   Diabetes mellitus type 2 in obese (HCC)   Acute on chronic dyspnea likely multifactorial secondary to acute asthma exacerbation with significant component of reflux disease. Chest x-ray done on 12/10/2020 personally reviewed showing no lobular infiltrates or increase in pulmonary vascularity to suggest pulmonary edema, no active  cardiopulmonary disease. She was started on IV Solu-Medrol in the ED, weaned off on 12/14/2020. Continue scheduled nebulizers, Pulmicort, and Singulair.  Pulmonary following, appreciate assistance. Has received diuretics intermittently. Her oxygen saturation is stable, 96% on room air. Esophagram completed on 12/14/2020 shows no significant abnormality seen in the esophagus. Speech therapist consulted, MBS pending, follow results  Type 2 diabetes with hyperglycemia, exacerbated by high doses of IV Solu-Medrol, stopped on 12/14/2020. Hemoglobin A1c 6.7 on 12/12/2020 Currently on Lantus 13 units twice daily, NovoLog 8 units 3 times daily, insulin sliding scale Appreciate diabetes coordinator's assistance.  Diabetic neuropathy Continue home regimen  Hypothyroidism Continue home levothyroxine  Chronic anxiety/depression Continue home regimen  GERD Continue home PPI Protonix dose increased to 80 mg twice daily due to concern for significant reflux disease.  Hyperlipidemia Continue home Crestor  History of COVID-19 pneumonia in July 2021 Positive COVID-19 test on 06/21/2020 was admitted and treated for COVID-19 pneumonia. Repeated COVID-19 screening test on 12/10/2020   Physical debility/ambulatory dysfunction PT assessed and recommended home health PT Patient has declined home health PT Continue PT OT in house with assistance and fall precautions    Code Status: Full code.  Family Communication: None at bedside  Disposition Plan: Home likely on 12/14/2020 or when pulmonary signs off.  Consultants:  Pulmonary  Procedures:  2D echo  Antimicrobials:  None  DVT prophylaxis:  Subcu Lovenox daily   Status is: Inpatient   Dispo:  Patient From: Home  Planned Disposition: Home with Health Care Svc  Expected discharge date: 12/16/20  Medically stable for discharge: No, ongoing management of acute asthma exacerbation.         Objective: Vitals:   12/13/20 2352  12/14/20 0500 12/14/20 0750 12/14/20 1203  BP:  120/62 130/68  (!) 129/53  Pulse: 62 61 63 72  Resp: 17 17 18 18   Temp: (!) 97.5 F (36.4 C) (!) 97.5 F (36.4 C)  98 F (36.7 C)  TempSrc: Oral Oral  Oral  SpO2: 100% 97% 98% 95%  Weight:  81.1 kg    Height:        Intake/Output Summary (Last 24 hours) at 12/14/2020 1441 Last data filed at 12/14/2020 0900 Gross per 24 hour  Intake --  Output 2300 ml  Net -2300 ml   Filed Weights   12/10/20 1215 12/12/20 1226 12/14/20 0500  Weight: 83.5 kg 83.1 kg 81.1 kg    Exam:  . General: 65 y.o. year-old female pleasant well developed well nourished alert and oriented x3. . Cardiovascular: Regular rate and rhythm no rubs or gallops.  Respiratory: Clear to auscultation with no rales, no wheezes.  Good inspiratory effort.   . Abdomen: Obese nontender normal bowel sounds present..  . Musculoskeletal: No lower extremity edema bilaterally.   . Skin: No ulcerative lesions noted. Marland Kitchen Psychiatry: Mood is appropriate for condition and setting.   Data Reviewed: CBC: Recent Labs  Lab 12/10/20 1313 12/11/20 0322 12/11/20 0428  WBC 11.3* 11.3*  --   HGB 12.5 12.2 11.9*  HCT 37.3 36.6 35.0*  MCV 87.4 89.1  --   PLT 357 363  --    Basic Metabolic Panel: Recent Labs  Lab 12/10/20 1313 12/11/20 0322 12/11/20 0428 12/12/20 1202 12/13/20 0758  NA 137 137 138 136 132*  K 4.3 4.2 4.3 4.3 4.2  CL 103 101  --  102 99  CO2 21* 21*  --  20* 20*  GLUCOSE 160* 273*  --  237* 414*  BUN 11 20  --  22 32*  CREATININE 1.00 1.79*  --  1.39* 1.39*  CALCIUM 10.2 9.6  --  9.6 9.3  MG  --   --   --  1.8 1.8  PHOS  --   --   --   --  4.2   GFR: Estimated Creatinine Clearance: 40.9 mL/min (A) (by C-G formula based on SCr of 1.39 mg/dL (H)). Liver Function Tests: No results for input(s): AST, ALT, ALKPHOS, BILITOT, PROT, ALBUMIN in the last 168 hours. No results for input(s): LIPASE, AMYLASE in the last 168 hours. No results for input(s): AMMONIA in  the last 168 hours. Coagulation Profile: No results for input(s): INR, PROTIME in the last 168 hours. Cardiac Enzymes: No results for input(s): CKTOTAL, CKMB, CKMBINDEX, TROPONINI in the last 168 hours. BNP (last 3 results) No results for input(s): PROBNP in the last 8760 hours. HbA1C: Recent Labs    12/12/20 1202  HGBA1C 6.7*   CBG: Recent Labs  Lab 12/13/20 1107 12/13/20 1606 12/13/20 2134 12/14/20 0606 12/14/20 1114  GLUCAP 224* 132* 267* 149* 232*   Lipid Profile: No results for input(s): CHOL, HDL, LDLCALC, TRIG, CHOLHDL, LDLDIRECT in the last 72 hours. Thyroid Function Tests: No results for input(s): TSH, T4TOTAL, FREET4, T3FREE, THYROIDAB in the last 72 hours. Anemia Panel: No results for input(s): VITAMINB12, FOLATE, FERRITIN, TIBC, IRON, RETICCTPCT in the last 72 hours. Urine analysis:    Component Value Date/Time   COLORURINE YELLOW 06/21/2020 [place] 06/21/2020 1321   LABSPEC 1.011 06/21/2020 1321   PHURINE 5.0 06/21/2020 1321   GLUCOSEU NEGATIVE 06/21/2020 1321   HGBUR NEGATIVE 06/21/2020 1321   BILIRUBINUR NEGATIVE 06/21/2020 1321   KETONESUR NEGATIVE 06/21/2020 1321   PROTEINUR NEGATIVE  06/21/2020 1321   UROBILINOGEN 0.2 12/06/2011 1058   NITRITE NEGATIVE 06/21/2020 1321   LEUKOCYTESUR NEGATIVE 06/21/2020 1321   Sepsis Labs: @LABRCNTIP (procalcitonin:4,lacticidven:4)  ) Recent Results (from the past 240 hour(s))  Resp Panel by RT-PCR (Flu A&B, Covid) Nasopharyngeal Swab     Status: None   Collection Time: 12/10/20 11:49 PM   Specimen: Nasopharyngeal Swab; Nasopharyngeal(NP) swabs in vial transport medium  Result Value Ref Range Status   SARS Coronavirus 2 by RT PCR NEGATIVE NEGATIVE Final    Comment: (NOTE) SARS-CoV-2 target nucleic acids are NOT DETECTED.  The SARS-CoV-2 RNA is generally detectable in upper respiratory specimens during the acute phase of infection. The lowest concentration of SARS-CoV-2 viral copies this  assay can detect is 138 copies/mL. A negative result does not preclude SARS-Cov-2 infection and should not be used as the sole basis for treatment or other patient management decisions. A negative result may occur with  improper specimen collection/handling, submission of specimen other than nasopharyngeal swab, presence of viral mutation(s) within the areas targeted by this assay, and inadequate number of viral copies(<138 copies/mL). A negative result must be combined with clinical observations, patient history, and epidemiological information. The expected result is Negative.  Fact Sheet for Patients:  EntrepreneurPulse.com.au  Fact Sheet for Healthcare Providers:  IncredibleEmployment.be  This test is no t yet approved or cleared by the [place] FDA and  has been authorized for detection and/or diagnosis of SARS-CoV-2 by FDA under an Emergency Use Authorization (EUA). This EUA will remain  in effect (meaning this test can be used) for the duration of the COVID-19 declaration under Section 564(b)(1) of the Act, 21 U.S.C.section 360bbb-3(b)(1), unless the authorization is terminated  or revoked sooner.       Influenza A by PCR NEGATIVE NEGATIVE Final   Influenza B by PCR NEGATIVE NEGATIVE Final    Comment: (NOTE) The Xpert Xpress SARS-CoV-2/FLU/RSV plus assay is intended as an aid in the diagnosis of influenza from Nasopharyngeal swab specimens and should not be used as a sole basis for treatment. Nasal washings and aspirates are unacceptable for Xpert Xpress SARS-CoV-2/FLU/RSV testing.  Fact Sheet for Patients: EntrepreneurPulse.com.au  Fact Sheet for Healthcare Providers: IncredibleEmployment.be  This test is not yet approved or cleared by the [place] FDA and has been authorized for detection and/or diagnosis of SARS-CoV-2 by FDA under an Emergency Use Authorization (EUA). This EUA will  remain in effect (meaning this test can be used) for the duration of the COVID-19 declaration under Section 564(b)(1) of the Act, 21 U.S.C. section 360bbb-3(b)(1), unless the authorization is terminated or revoked.  Performed at [facility], [place] [address] [place], [place] [address]       Studies: DG ESOPHAGUS W SINGLE CM (SOL OR THIN BA)  Result Date: 12/14/2020 CLINICAL DATA:  Dysphagia. EXAM: ESOPHOGRAM/BARIUM SWALLOW TECHNIQUE: Single contrast examination was performed using thin barium. Exam was somewhat limited as the patient could not stand and the exam was performed in nearly supine position. As result, barium tablet was not administered. FLUOROSCOPY TIME:  Radiation Exposure Index (if provided by the fluoroscopic device): 34.3 mGy. COMPARISON:  October 21, 2018. FINDINGS: Minimal tertiary contractions were noted suggesting minimal presbyesophagus. No definite mass or stricture is noted. No definite hiatal hernia or reflux was noted. IMPRESSION: No significant abnormality seen in the esophagus. Electronically Signed   By: Marijo Conception M.D.   On: 12/14/2020 10:10    Scheduled Meds: . albuterol  2.5 mg Nebulization QID  .  budesonide (PULMICORT) nebulizer solution  0.25 mg Nebulization BID  . buPROPion  300 mg Oral Daily  . busPIRone  10 mg Oral TID  . cholecalciferol  1,000 Units Oral Daily  . enoxaparin (LOVENOX) injection  40 mg Subcutaneous Q24H  . famotidine  40 mg Oral QHS  . fluticasone  2 spray Each Nare BID  . gabapentin  300 mg Oral TID  . gemfibrozil  600 mg Oral BID AC  . insulin aspart  0-9 Units Subcutaneous TID WC  . insulin aspart  8 Units Subcutaneous TID WC  . insulin glargine  13 Units Subcutaneous BID  . levothyroxine  200 mcg Oral Q0600  . meclizine  25 mg Oral TID  . montelukast  10 mg Oral QHS  . pantoprazole  80 mg Oral BID  . rosuvastatin  10 mg Oral QHS    Continuous Infusions:   LOS: 3 days     Kayleen Memos, MD [facility] Pager [phone]  If 7PM-7AM, please contact night-coverage [email or web address] Password [facility] 12/14/2020, 2:41 PM

## 2020-12-14 NOTE — TOC Progression Note (Signed)
Transition of Care [facility]) - Progression Note    Patient Details  Name: Chelsea Kerr MRN: [id number] Date of Birth: 1956-01-09  [facility]) CM/SW Contact  Jacalyn Lefevre Edson Snowball, RN Phone Number: 12/14/2020, 1:02 PM  Clinical Narrative:     Discussed HHPT with patient again at bedside. She will think about it over the next couple of days. Patient aware that once she discharges home and decides she wants HHPT , she will have to call PCP office to arrange.   Expected Discharge Plan: Home/Self Care Barriers to Discharge: Continued Medical Work up  Expected Discharge Plan and Services Expected Discharge Plan: Home/Self Care   Discharge Planning Services: CM Consult Post Acute Care Choice: [place] arrangements for the past 2 months: Single Family Home                 DME Arranged: N/A DME Agency: NA       HH Arranged: Refused HH           Social Determinants of Health (SDOH) Interventions    Readmission Risk Interventions No flowsheet data found.

## 2020-12-15 ENCOUNTER — Inpatient Hospital Stay (HOSPITAL_COMMUNITY): Payer: Medicare Other

## 2020-12-15 DIAGNOSIS — R0602 Shortness of breath: Secondary | ICD-10-CM

## 2020-12-15 DIAGNOSIS — J4541 Moderate persistent asthma with (acute) exacerbation: Secondary | ICD-10-CM | POA: Diagnosis not present

## 2020-12-15 LAB — BASIC METABOLIC PANEL
Anion gap: 10 (ref 5–15)
BUN: 38 mg/dL — ABNORMAL HIGH (ref 8–23)
CO2: 25 mmol/L (ref 22–32)
Calcium: 9.6 mg/dL (ref 8.9–10.3)
Chloride: 103 mmol/L (ref 98–111)
Creatinine, Ser: 1.51 mg/dL — ABNORMAL HIGH (ref 0.44–1.00)
GFR, Estimated: 39 mL/min — ABNORMAL LOW (ref >60)
Glucose, Bld: 140 mg/dL — ABNORMAL HIGH (ref 70–99)
Potassium: 4.2 mmol/L (ref 3.5–5.1)
Sodium: 138 mmol/L (ref 135–145)

## 2020-12-15 LAB — CBC
HCT: 39.2 % (ref 36.0–46.0)
Hemoglobin: 12.6 g/dL (ref 12.0–15.0)
MCH: 28.9 pg (ref 26.0–34.0)
MCHC: 32.1 g/dL (ref 30.0–36.0)
MCV: 89.9 fL (ref 80.0–100.0)
Platelets: 352 10*3/uL (ref 150–400)
RBC: 4.36 MIL/uL (ref 3.87–5.11)
RDW: 13.8 % (ref 11.5–15.5)
WBC: 10.5 10*3/uL (ref 4.0–10.5)
nRBC: 0 % (ref 0.0–0.2)

## 2020-12-15 LAB — ECHOCARDIOGRAM COMPLETE
AR max vel: 1.78 cm2
AV Area VTI: 1.92 cm2
AV Area mean vel: 1.87 cm2
AV Mean grad: 3 mmHg
AV Peak grad: 6.6 mmHg
Ao pk vel: 1.28 m/s
Area-P 1/2: 2.5 cm2
Height: 62 in
S' Lateral: 3 cm
Weight: 2860.8 oz

## 2020-12-15 LAB — GLUCOSE, CAPILLARY
Glucose-Capillary: 128 mg/dL — ABNORMAL HIGH (ref 70–99)
Glucose-Capillary: 100 mg/dL — ABNORMAL HIGH (ref 70–99)
Glucose-Capillary: 99 mg/dL (ref 70–99)
Glucose-Capillary: 96 mg/dL (ref 70–99)

## 2020-12-15 MED ORDER — BUDESONIDE 0.5 MG/2ML IN SUSP
0.5000 mg | Freq: Two times a day (BID) | RESPIRATORY_TRACT | Status: DC
Start: 2020-12-15 — End: 2020-12-17
  Administered 2020-12-15 – 2020-12-17 (×3): 0.5 mg via RESPIRATORY_TRACT
  Filled 2020-12-15 (×3): qty 2

## 2020-12-15 MED ORDER — MELOXICAM 7.5 MG PO TABS
15.0000 mg | ORAL_TABLET | Freq: Every day | ORAL | Status: DC
  Administered 2020-12-15 – 2020-12-17 (×3): 15 mg via ORAL
  Filled 2020-12-15 (×3): qty 2

## 2020-12-15 MED ORDER — SUCRALFATE 1 G PO TABS
1.0000 g | ORAL_TABLET | Freq: Two times a day (BID) | ORAL | Status: DC
  Administered 2020-12-15 – 2020-12-17 (×5): 1 g via ORAL
  Filled 2020-12-15 (×5): qty 1

## 2020-12-15 MED ORDER — BUSPIRONE HCL 15 MG PO TABS
15.0000 mg | ORAL_TABLET | Freq: Three times a day (TID) | ORAL | Status: DC
  Administered 2020-12-15 – 2020-12-17 (×6): 15 mg via ORAL
  Filled 2020-12-15 (×6): qty 1

## 2020-12-15 MED ORDER — PERFLUTREN LIPID MICROSPHERE
1.0000 mL | INTRAVENOUS | Status: AC | PRN
  Administered 2020-12-15: 3 mL via INTRAVENOUS
  Filled 2020-12-15: qty 10

## 2020-12-15 MED ORDER — ONDANSETRON HCL 4 MG/2ML IJ SOLN
4.0000 mg | Freq: Four times a day (QID) | INTRAMUSCULAR | Status: DC | PRN
  Administered 2020-12-15: 4 mg via INTRAVENOUS
  Filled 2020-12-15: qty 2

## 2020-12-15 NOTE — Progress Notes (Signed)
   NAME:  Chelsea Kerr, MRN:  [id number], DOB:  12-Aug-1956, LOS: 4 ADMISSION DATE:  12/10/2020, CONSULTATION DATE: 1/10 REFERRING MD: [place] Crane, CHIEF COMPLAINT: Asthmatic exacerbation not responding to therapy  Brief History:  65 year old female who carries a history of asthma admitted 1/7 with working diagnosis of asthmatic exacerbation.  Treated in the usual fashion including scheduled bronchodilators, scheduled inhaled steroids and systemic steroids.  Pulmonary asked to see on 1/10 as patient not responding to treatment  Past Medical History:  Asthma, type 2 diabetes, hyperlipidemia, hypothyroidism, chronic anxiety and depression. GERD, esophageal stricture requiring dilation, hiatal hernia  Significant Hospital Events:  1/7 admitted 1/8 steroids increased  1/9 steroids increased 1/10 pulmonary consulted  Consults:  Pulmonary consulted 1/10  Procedures:  Echocardiogram being done 12/15/2020  Significant Diagnostic Tests:  1/7 CT chest negative for pulmonary emboli, diffuse mild bronchial wall thickening 1/11 esophagram: no sig stricture, dyfxn or reflux  Micro Data:  Respiratory viral panel 1/7 -  Antimicrobials:  none Interim History / Subjective:  Feels slightly better  Objective   Blood pressure (!) 119/59, pulse 65, temperature 97.6 F (36.4 C), temperature source Oral, resp. rate 16, height 5\' 2"  (1.575 m), weight 81.1 kg, SpO2 97 %.        Intake/Output Summary (Last 24 hours) at 12/15/2020 1020 Last data filed at 12/15/2020 0545 Gross per 24 hour  Intake 240 ml  Output 1700 ml  Net -1460 ml   Filed Weights   12/12/20 1226 12/14/20 0500 12/15/20 0539  Weight: 83.1 kg 81.1 kg 81.1 kg    Examination: Generally anxious, Pulm : Lungs with clear breath sounds Card rrr abd soft not tender Ext warm and dry  Neuro intact  Resolved Hospital Problem list     Assessment & Plan:   Acute on chronic dyspnea Asthmatic exacerbation Cough Reflux Sinus  congestion History of gastric stricture with prior dilatation History of hiatal hernia  Multifactorial reasons for symptomatology Esophagogram was not conclusive Symptomatically improving  Continue PPI Continue steroids -On Pulmicort nebulizations increased to 0.5 twice daily Continue reflux precautions Bronchodilators  PFT as outpatient  Will follow  Sherrilyn Rist, MD [facility] Pager: [phone]
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
## 2020-12-15 NOTE — Progress Notes (Signed)
PROGRESS NOTE  Chelsea Kerr ZOX:[id number] DOB: 1956-11-01 DOA: 12/10/2020 PCP: Isaias Sakai, DO  HPI/Recap of past 24 hours:  Chelsea Kerr a 65 y.o.femalewithhistory of asthma, type II diabetes mellitus, hyperlipidemia, hypothyroidism and chronic anxiety/depression presents to the ER with complaint of shortness of breath. Patient has been having shortness of breath off and on for last 1 week with some semi-productive cough. Denies fever chills or chest pain. Patient states she has been compliant with her inhaler but still wheezing with dyspnea.   Hospital course complicated by persistent dyspnea with minimal exertion despite current therapies.  Pulmonary consulted, appreciate assistance.     Assessment/Plan: Principal Problem:   Asthma exacerbation Active Problems:   Diabetes mellitus type 2 in obese (HCC)   Acute on chronic dyspnea likely multifactorial secondary to acute asthma exacerbation with significant component of reflux disease. Chest x-ray done on 12/10/2020: showing no lobular infiltrates or increase in pulmonary vascularity to suggest pulmonary edema, no active cardiopulmonary disease. She was started on IV Solu-Medrol in the ED, weaned off on 12/14/2020. Continue scheduled nebulizers, Pulmicort, and Singulair.  Pulmonary following, appreciate assistance. Has received diuretics intermittently. Her oxygen saturation is stable, 96% on room air. Esophagram completed on 12/14/2020 shows no significant abnormality seen in the esophagus. Speech therapist consulted, MBS pending, follow results -echo pending -will need outpatient PFTs  Type 2 diabetes with hyperglycemia, exacerbated by high doses of IV Solu-Medrol, stopped on 12/14/2020. Hemoglobin A1c 6.7 on 12/12/2020 Currently on Lantus 13 units twice daily, NovoLog 8 units 3 times daily, insulin sliding scale Appreciate diabetes coordinator's assistance.  AKI -due to diuretics -hold any lasix -BMP in  AM  Diabetic neuropathy Continue home regimen  Hypothyroidism Continue home levothyroxine  Chronic anxiety/depression Continue home regimen  GERD Continue home PPI Protonix dose increased to 80 mg twice daily due to concern for significant reflux disease.  Hyperlipidemia Continue home Crestor  History of COVID-19 pneumonia in July 2021 Positive COVID-19 test on 06/21/2020 was admitted and treated for COVID-19 pneumonia. Repeated COVID-19 screening test on 12/10/2020   Physical debility/ambulatory dysfunction PT assessed and recommended home health PT Patient has declined home health PT Continue PT/OT in house with assistance and fall precautions    Code Status: Full code.  Family Communication: None at bedside  Disposition Plan: Home likely 1-2 days  Consultants:  Pulmonary     DVT prophylaxis:  Subcu Lovenox daily   Status is: Inpatient   Dispo:  Patient From: Home  Planned Disposition: Home with Health Care Svc  Expected discharge date: 12/16/20  Medically stable for discharge: No, ongoing management of acute asthma exacerbation.      Still with some SOB at rest in bed-- does not tolerate O2 due to smell   Objective: Vitals:   12/14/20 2053 12/15/20 0103 12/15/20 0539 12/15/20 0753  BP:  (!) 113/55 (!) 119/59   Pulse:  72 63 65  Resp:  18 18 16   Temp:  97.7 F (36.5 C) 97.6 F (36.4 C)   TempSrc:  Oral Oral   SpO2: 98% 95% 96% 97%  Weight:   81.1 kg   Height:        Intake/Output Summary (Last 24 hours) at 12/15/2020 1114 Last data filed at 12/15/2020 0545 Gross per 24 hour  Intake 240 ml  Output 1700 ml  Net -1460 ml   Filed Weights   12/12/20 1226 12/14/20 0500 12/15/20 0539  Weight: 83.1 kg 81.1 kg 81.1 kg    Exam:  General: Appearance:    Obese female  Who appears anxious     Lungs:     Clear to auscultation bilaterally, respirations unlabored  Heart:    Normal heart rate. Normal rhythm. No murmurs, rubs, or gallops.    MS:   All extremities are intact.   Neurologic:   Awake, alert, oriented x 3. No apparent focal neurological           defect.     Data Reviewed: CBC: Recent Labs  Lab 12/10/20 1313 12/11/20 0322 12/11/20 0428 12/15/20 0417  WBC 11.3* 11.3*  --  10.5  HGB 12.5 12.2 11.9* 12.6  HCT 37.3 36.6 35.0* 39.2  MCV 87.4 89.1  --  89.9  PLT 357 363  --  254   Basic Metabolic Panel: Recent Labs  Lab 12/10/20 1313 12/11/20 0322 12/11/20 0428 12/12/20 1202 12/13/20 0758 12/15/20 0417  NA 137 137 138 136 132* 138  K 4.3 4.2 4.3 4.3 4.2 4.2  CL 103 101  --  102 99 103  CO2 21* 21*  --  20* 20* 25  GLUCOSE 160* 273*  --  237* 414* 140*  BUN 11 20  --  22 32* 38*  CREATININE 1.00 1.79*  --  1.39* 1.39* 1.51*  CALCIUM 10.2 9.6  --  9.6 9.3 9.6  MG  --   --   --  1.8 1.8  --   PHOS  --   --   --   --  4.2  --    GFR: Estimated Creatinine Clearance: 37.6 mL/min (A) (by C-G formula based on SCr of 1.51 mg/dL (H)). Liver Function Tests: No results for input(s): AST, ALT, ALKPHOS, BILITOT, PROT, ALBUMIN in the last 168 hours. No results for input(s): LIPASE, AMYLASE in the last 168 hours. No results for input(s): AMMONIA in the last 168 hours. Coagulation Profile: No results for input(s): INR, PROTIME in the last 168 hours. Cardiac Enzymes: No results for input(s): CKTOTAL, CKMB, CKMBINDEX, TROPONINI in the last 168 hours. BNP (last 3 results) No results for input(s): PROBNP in the last 8760 hours. HbA1C: Recent Labs    12/12/20 1202  HGBA1C 6.7*   CBG: Recent Labs  Lab 12/14/20 1114 12/14/20 1615 12/14/20 2151 12/15/20 0559 12/15/20 1111  GLUCAP 232* 116* 115* 128* 100*   Lipid Profile: No results for input(s): CHOL, HDL, LDLCALC, TRIG, CHOLHDL, LDLDIRECT in the last 72 hours. Thyroid Function Tests: No results for input(s): TSH, T4TOTAL, FREET4, T3FREE, THYROIDAB in the last 72 hours. Anemia Panel: No results for input(s): VITAMINB12, FOLATE, FERRITIN, TIBC, IRON,  RETICCTPCT in the last 72 hours. Urine analysis:    Component Value Date/Time   COLORURINE YELLOW 06/21/2020 1321   APPEARANCEUR CLEAR 06/21/2020 1321   LABSPEC 1.011 06/21/2020 1321   PHURINE 5.0 06/21/2020 1321   GLUCOSEU NEGATIVE 06/21/2020 1321   HGBUR NEGATIVE 06/21/2020 1321   BILIRUBINUR NEGATIVE 06/21/2020 1321   KETONESUR NEGATIVE 06/21/2020 1321   PROTEINUR NEGATIVE 06/21/2020 1321   UROBILINOGEN 0.2 12/06/2011 1058   NITRITE NEGATIVE 06/21/2020 1321   LEUKOCYTESUR NEGATIVE 06/21/2020 1321   Sepsis Labs: @LABRCNTIP (procalcitonin:4,lacticidven:4)  ) Recent Results (from the past 240 hour(s))  Resp Panel by RT-PCR (Flu A&B, Covid) Nasopharyngeal Swab     Status: None   Collection Time: 12/10/20 11:49 PM   Specimen: Nasopharyngeal Swab; Nasopharyngeal(NP) swabs in vial transport medium  Result Value Ref Range Status   SARS Coronavirus 2 by RT PCR NEGATIVE NEGATIVE Final    Comment: (  NOTE) SARS-CoV-2 target nucleic acids are NOT DETECTED.  The SARS-CoV-2 RNA is generally detectable in upper respiratory specimens during the acute phase of infection. The lowest concentration of SARS-CoV-2 viral copies this assay can detect is 138 copies/mL. A negative result does not preclude SARS-Cov-2 infection and should not be used as the sole basis for treatment or other patient management decisions. A negative result may occur with  improper specimen collection/handling, submission of specimen other than nasopharyngeal swab, presence of viral mutation(s) within the areas targeted by this assay, and inadequate number of viral copies(<138 copies/mL). A negative result must be combined with clinical observations, patient history, and epidemiological information. The expected result is Negative.  Fact Sheet for Patients:  EntrepreneurPulse.com.au  Fact Sheet for Healthcare Providers:  IncredibleEmployment.be  This test is no t yet approved or  cleared by the [place] FDA and  has been authorized for detection and/or diagnosis of SARS-CoV-2 by FDA under an Emergency Use Authorization (EUA). This EUA will remain  in effect (meaning this test can be used) for the duration of the COVID-19 declaration under Section 564(b)(1) of the Act, 21 U.S.C.section 360bbb-3(b)(1), unless the authorization is terminated  or revoked sooner.       Influenza A by PCR NEGATIVE NEGATIVE Final   Influenza B by PCR NEGATIVE NEGATIVE Final    Comment: (NOTE) The Xpert Xpress SARS-CoV-2/FLU/RSV plus assay is intended as an aid in the diagnosis of influenza from Nasopharyngeal swab specimens and should not be used as a sole basis for treatment. Nasal washings and aspirates are unacceptable for Xpert Xpress SARS-CoV-2/FLU/RSV testing.  Fact Sheet for Patients: EntrepreneurPulse.com.au  Fact Sheet for Healthcare Providers: IncredibleEmployment.be  This test is not yet approved or cleared by the [place] FDA and has been authorized for detection and/or diagnosis of SARS-CoV-2 by FDA under an Emergency Use Authorization (EUA). This EUA will remain in effect (meaning this test can be used) for the duration of the COVID-19 declaration under Section 564(b)(1) of the Act, 21 U.S.C. section 360bbb-3(b)(1), unless the authorization is terminated or revoked.  Performed at [facility], [place] [address] [place],  [address]       Studies: ECHOCARDIOGRAM COMPLETE  Result Date: 12/15/2020    ECHOCARDIOGRAM REPORT   Patient Name:   BRANDT PORTZ Date of Exam: 12/15/2020 Medical Rec #:  [id number]     Height:       62.0 in Accession #:    [id number]    Weight:       178.8 lb Date of Birth:  06-Nov-1956     BSA:          1.823 m Patient Age:    36 years      BP:           119/59 mmHg Patient Gender: F             HR:           63 bpm. Exam Location:  Inpatient Procedure: 2D Echo, Color Doppler, Cardiac  Doppler and Intracardiac            Opacification Agent Indications:    Dyspnea  History:        Patient has no prior history of Echocardiogram examinations.                 Signs/Symptoms:Shortness of Breath; Risk Factors:Diabetes and                 Dyslipidemia.  Sonographer:  Clayton Lefort RDCS (AE) Referring Phys: [id number] PETER E BABCOCK  Sonographer Comments: Technically difficult study due to poor echo windows. IMPRESSIONS  1. Abnormal septal motion . Left ventricular ejection fraction, by estimation, is 55 to 60%. The left ventricle has normal function. The left ventricle has no regional wall motion abnormalities. Left ventricular diastolic parameters were normal.  2. Right ventricular systolic function is normal. The right ventricular size is normal.  3. The mitral valve is normal in structure. No evidence of mitral valve regurgitation. No evidence of mitral stenosis.  4. The aortic valve is normal in structure. Aortic valve regurgitation is not visualized. No aortic stenosis is present.  5. The inferior vena cava is normal in size with greater than 50% respiratory variability, suggesting right atrial pressure of 3 mmHg. FINDINGS  Left Ventricle: Abnormal septal motion. Left ventricular ejection fraction, by estimation, is 55 to 60%. The left ventricle has normal function. The left ventricle has no regional wall motion abnormalities. Definity contrast agent was given IV to delineate the left ventricular endocardial borders. The left ventricular internal cavity size was normal in size. There is no left ventricular hypertrophy. Left ventricular diastolic parameters were normal. Right Ventricle: The right ventricular size is normal. No increase in right ventricular wall thickness. Right ventricular systolic function is normal. Left Atrium: Left atrial size was normal in size. Right Atrium: Right atrial size was normal in size. Pericardium: There is no evidence of pericardial effusion. Mitral Valve: The mitral  valve is normal in structure. No evidence of mitral valve regurgitation. No evidence of mitral valve stenosis. Tricuspid Valve: The tricuspid valve is normal in structure. Tricuspid valve regurgitation is not demonstrated. No evidence of tricuspid stenosis. Aortic Valve: The aortic valve is normal in structure. Aortic valve regurgitation is not visualized. No aortic stenosis is present. Aortic valve mean gradient measures 3.0 mmHg. Aortic valve peak gradient measures 6.6 mmHg. Aortic valve area, by VTI measures 1.92 cm. Pulmonic Valve: The pulmonic valve was normal in structure. Pulmonic valve regurgitation is not visualized. No evidence of pulmonic stenosis. Aorta: The aortic root is normal in size and structure. Venous: The inferior vena cava is normal in size with greater than 50% respiratory variability, suggesting right atrial pressure of 3 mmHg. IAS/Shunts: No atrial level shunt detected by color flow Doppler.  LEFT VENTRICLE PLAX 2D LVIDd:         4.50 cm  Diastology LVIDs:         3.00 cm  LV e' medial:    4.79 cm/s LV PW:         1.60 cm  LV E/e' medial:  14.2 LV IVS:        1.30 cm  LV e' lateral:   4.68 cm/s LVOT diam:     1.90 cm  LV E/e' lateral: 14.6 LV SV:         45 LV SV Index:   24 LVOT Area:     2.84 cm  RIGHT VENTRICLE            IVC RV Basal diam:  3.40 cm    IVC diam: 1.70 cm RV S prime:     8.49 cm/s TAPSE (M-mode): 2.4 cm LEFT ATRIUM             Index       RIGHT ATRIUM           Index LA diam:        2.60 cm 1.43 cm/m  RA Area:  15.80 cm LA Vol (A2C):   24.9 ml 13.66 ml/m RA Volume:   43.00 ml  23.59 ml/m LA Vol (A4C):   36.6 ml 20.08 ml/m LA Biplane Vol: 30.9 ml 16.95 ml/m  AORTIC VALVE AV Area (Vmax):    1.78 cm AV Area (Vmean):   1.87 cm AV Area (VTI):     1.92 cm AV Vmax:           128.00 cm/s AV Vmean:          85.900 cm/s AV VTI:            0.232 m AV Peak Grad:      6.6 mmHg AV Mean Grad:      3.0 mmHg LVOT Vmax:         80.30 cm/s LVOT Vmean:        56.800 cm/s LVOT  VTI:          0.157 m LVOT/AV VTI ratio: 0.68  AORTA Ao Root diam: 2.90 cm Ao Asc diam:  2.80 cm MITRAL VALVE MV Area (PHT): 2.50 cm    SHUNTS MV Decel Time: 303 msec    Systemic VTI:  0.16 m MV E velocity: 68.10 cm/s  Systemic Diam: 1.90 cm MV A velocity: 75.80 cm/s MV E/A ratio:  0.90 Jenkins Rouge MD Electronically signed by Jenkins Rouge MD Signature Date/Time: 12/15/2020/10:55:46 AM    Final     Scheduled Meds: . albuterol  2.5 mg Nebulization BID  . budesonide (PULMICORT) nebulizer solution  0.5 mg Nebulization BID  . buPROPion  300 mg Oral Daily  . busPIRone  10 mg Oral TID  . cholecalciferol  1,000 Units Oral Daily  . enoxaparin (LOVENOX) injection  40 mg Subcutaneous Q24H  . famotidine  40 mg Oral QHS  . fluticasone  2 spray Each Nare BID  . gabapentin  300 mg Oral TID  . gemfibrozil  600 mg Oral BID AC  . insulin aspart  0-9 Units Subcutaneous TID WC  . insulin aspart  8 Units Subcutaneous TID WC  . insulin glargine  13 Units Subcutaneous BID  . levothyroxine  200 mcg Oral Q0600  . meclizine  25 mg Oral TID  . montelukast  10 mg Oral QHS  . pantoprazole  80 mg Oral BID  . rosuvastatin  10 mg Oral QHS    Continuous Infusions:   LOS: 4 days     Geradine Girt, DO [facility]  If 7PM-7AM, please contact night-coverage [email or web address] Password [facility] 12/15/2020, 11:14 AM

## 2020-12-15 NOTE — Progress Notes (Signed)
  Echocardiogram 2D Echocardiogram has been performed with Definity.  Chelsea Kerr 12/15/2020, 10:44 AM

## 2020-12-16 ENCOUNTER — Inpatient Hospital Stay (HOSPITAL_COMMUNITY): Payer: Medicare Other

## 2020-12-16 LAB — BASIC METABOLIC PANEL
Anion gap: 12 (ref 5–15)
BUN: 39 mg/dL — ABNORMAL HIGH (ref 8–23)
CO2: 20 mmol/L — ABNORMAL LOW (ref 22–32)
Calcium: 9.2 mg/dL (ref 8.9–10.3)
Chloride: 104 mmol/L (ref 98–111)
Creatinine, Ser: 1.47 mg/dL — ABNORMAL HIGH (ref 0.44–1.00)
GFR, Estimated: 40 mL/min — ABNORMAL LOW (ref >60)
Glucose, Bld: 158 mg/dL — ABNORMAL HIGH (ref 70–99)
Potassium: 4.1 mmol/L (ref 3.5–5.1)
Sodium: 136 mmol/L (ref 135–145)

## 2020-12-16 LAB — GLUCOSE, CAPILLARY
Glucose-Capillary: 121 mg/dL — ABNORMAL HIGH (ref 70–99)
Glucose-Capillary: 123 mg/dL — ABNORMAL HIGH (ref 70–99)
Glucose-Capillary: 84 mg/dL (ref 70–99)
Glucose-Capillary: 111 mg/dL — ABNORMAL HIGH (ref 70–99)

## 2020-12-16 MED ORDER — INSULIN GLARGINE 100 UNIT/ML ~~LOC~~ SOLN
13.0000 [IU] | Freq: Every day | SUBCUTANEOUS | Status: DC
  Administered 2020-12-17: 13 [IU] via SUBCUTANEOUS
  Filled 2020-12-16: qty 0.13

## 2020-12-16 MED ORDER — HYDROCODONE-ACETAMINOPHEN 5-325 MG PO TABS
1.0000 | ORAL_TABLET | Freq: Once | ORAL | Status: AC
  Administered 2020-12-16: 1 via ORAL
  Filled 2020-12-16: qty 1
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
## 2020-12-16 NOTE — Care Management Important Message (Signed)
Important Message  Patient Details  Name: NIKEYA MAXIM MRN: [id number] Date of Birth: 10-Nov-1956   Medicare Important Message Given:  Yes     Barb Merino Devra Stare 12/16/2020, 3:14 PM

## 2020-12-16 NOTE — TOC Progression Note (Addendum)
Transition of Care [facility]) - Progression Note    Patient Details  Name: Chelsea Kerr MRN: [id number] Date of Birth: 03-06-1956  [facility] of [facility] CM/SW Contact  Jacalyn Lefevre Edson Snowball, RN Phone Number: 12/16/2020, 12:52 PM  Clinical Narrative:     Patient is now agreeable to home health PT/OT . No preference. Messaged Amy with [facility] , awaiting response.  Amy with [facility] will accept  Patient already has walker and 3 in 1 at home.   Expected Discharge Plan: Home/Self Care Barriers to Discharge: Continued Medical Work up  Expected Discharge Plan and Services Expected Discharge Plan: Home/Self Care   Discharge Planning Services: CM Consult Post Acute Care Choice: [place] arrangements for the past 2 months: Single Family Home                 DME Arranged: N/A DME Agency: NA       HH Arranged: Refused HH           Social Determinants of Health (SDOH) Interventions    Readmission Risk Interventions No flowsheet data found.

## 2020-12-16 NOTE — Progress Notes (Signed)
Physical Therapy Treatment Patient Details Name: Chelsea Kerr MRN: [id number] DOB: 21-May-1956 Today's Date: 12/16/2020    History of Present Illness 65yo female c/o SOB. CTA negative for PE, Covid test negative. Admitted with acute asthma exacerbation. PMH DM, COPD, asthma, anxiety    PT Comments    Patient received in bed, pleasant and willing to participate in session. Continues to report some dizziness but it has improved with the vestibular exercises given to her last session. Able to progress gait distance today, but limited by fatigue and continues to require MinA due to multidirectional balance loss and general unsteadiness. Progressed vestibular exercises today by adding vertical and horizontal saccades. Left up in recliner with all needs met this afternoon, RN aware of patient status.     Follow Up Recommendations  Home health PT;Supervision/Assistance - 24 hour;Other (comment) (vestibular follow up)     Equipment Recommendations  Rolling walker with 5" wheels;3in1 (PT)    Recommendations for Other Services       Precautions / Restrictions Precautions Precautions: Fall;Other (comment) Precaution Comments: posterior bias, possible vertigo, watch sats Restrictions Weight Bearing Restrictions: No    Mobility  Bed Mobility Overal bed mobility: Needs Assistance Bed Mobility: Supine to Sit     Supine to sit: HOB elevated;Min assist     General bed mobility comments: HOB elevated, light minA to anchor and scoot hips forward to EOB  Transfers Overall transfer level: Needs assistance Equipment used: Rolling walker (2 wheeled) Transfers: Sit to/from Stand Sit to Stand: Min assist         General transfer comment: still needing MinA to boost all teh way to upright standing and stabilize- stronger posterior bias today  Ambulation/Gait Ambulation/Gait assistance: Min assist Gait Distance (Feet): 28 Feet Assistive device: Rolling walker (2 wheeled) Gait  Pattern/deviations: Step-through pattern;Decreased step length - right;Decreased step length - left;Decreased stride length;Trendelenburg;Leaning posteriorly;Narrow base of support;Drifts right/left Gait velocity: decreased   General Gait Details: MinA to maintain balance with gait- has mild multidirectional balance loss and also needed cues for proximity to device. Easily fatigued. SPO2 98% on RA.   Stairs             Wheelchair Mobility    Modified Rankin (Stroke Patients Only)       Balance Overall balance assessment: Needs assistance Sitting-balance support: Bilateral upper extremity supported;Feet supported Sitting balance-Leahy Scale: Good Sitting balance - Comments: S for safety   Standing balance support: Bilateral upper extremity supported;During functional activity Standing balance-Leahy Scale: Poor Standing balance comment: MinA to maintain balance with BUE support                            Cognition Arousal/Alertness: Awake/alert Behavior During Therapy: WFL for tasks assessed/performed;Anxious Overall Cognitive Status: Within Functional Limits for tasks assessed                                 General Comments: more clear today, did not notice STM or processing deficits and had good teachback of vestibular exercises      Exercises      General Comments        Pertinent Vitals/Pain Pain Assessment: 0-10 Pain Score: 7  Pain Location: HA Pain Descriptors / Indicators: Aching;Headache Pain Intervention(s): Limited activity within patient's tolerance;Monitored during session;Repositioned    Home Living  Prior Function            PT Goals (current goals can now be found in the care plan section) Acute Rehab PT Goals Patient Stated Goal: go home PT Goal Formulation: With patient Time For Goal Achievement: 12/27/20 Potential to Achieve Goals: Fair Progress towards PT goals: Progressing  toward goals    Frequency    Min 3X/week      PT Plan Current plan remains appropriate    Co-evaluation              AM-PAC PT "6 Clicks" Mobility   Outcome Measure  Help needed turning from your back to your side while in a flat bed without using bedrails?: None Help needed moving from lying on your back to sitting on the side of a flat bed without using bedrails?: A Little Help needed moving to and from a bed to a chair (including a wheelchair)?: A Little Help needed standing up from a chair using your arms (e.g., wheelchair or bedside chair)?: A Little Help needed to walk in hospital room?: A Little Help needed climbing 3-5 steps with a railing? : A Lot 6 Click Score: 18    End of Session Equipment Utilized During Treatment: Gait belt Activity Tolerance: Patient tolerated treatment well Patient left: in chair;with call bell/phone within reach Nurse Communication: Mobility status PT Visit Diagnosis: Unsteadiness on feet (R26.81);Difficulty in walking, not elsewhere classified (R26.2);Muscle weakness (generalized) (M62.81);Dizziness and giddiness (R42)     Time: 5638-7564 PT Time Calculation (min) (ACUTE ONLY): 18 min  Charges:  $Gait Training: 8-22 mins                     Windell Norfolk, DPT, PN1   Supplemental Physical Therapist [place]    Pager [phone] Acute Rehab Office [phone]

## 2020-12-16 NOTE — Progress Notes (Signed)
Modified Barium Swallow Progress Note  Patient Details  Name: NYGERIA LAGER MRN: [id number] Date of Birth: Oct 26, 1956  Today's Date: 12/16/2020  Modified Barium Swallow completed.  Full report located under Chart Review in the Imaging Section.  Brief recommendations include the following:  Clinical Impression  Pt demonstrates normal swallow function with no residue, aspiraiton or penetration. Esophageal sweep also appeared WNL though no radiologist present to evalaute. Pt did have intermittent coughing throughout intake and also reported globus though airway is clear. No SLP f/u needed will sign off.   Swallow Evaluation Recommendations       SLP Diet Recommendations: Regular solids;Thin liquid   Liquid Administration via: Cup;Straw   Medication Administration: Whole meds with liquid   Supervision: Patient able to self feed       Postural Changes: Seated upright at 90 degrees   Oral Care Recommendations: Patient independent with oral care       Herbie Baltimore, MA [place] Pager [phone] Office [phone]  Lynann Beaver 12/16/2020,12:00 PM

## 2020-12-17 LAB — BASIC METABOLIC PANEL
Anion gap: 9 (ref 5–15)
BUN: 36 mg/dL — ABNORMAL HIGH (ref 8–23)
CO2: 22 mmol/L (ref 22–32)
Calcium: 9.1 mg/dL (ref 8.9–10.3)
Chloride: 106 mmol/L (ref 98–111)
Creatinine, Ser: 1.37 mg/dL — ABNORMAL HIGH (ref 0.44–1.00)
GFR, Estimated: 43 mL/min — ABNORMAL LOW (ref >60)
Glucose, Bld: 139 mg/dL — ABNORMAL HIGH (ref 70–99)
Potassium: 4.1 mmol/L (ref 3.5–5.1)
Sodium: 137 mmol/L (ref 135–145)

## 2020-12-17 LAB — GLUCOSE, CAPILLARY
Glucose-Capillary: 130 mg/dL — ABNORMAL HIGH (ref 70–99)
Glucose-Capillary: 95 mg/dL (ref 70–99)

## 2020-12-17 MED ORDER — FLUTICASONE PROPIONATE 50 MCG/ACT NA SUSP: 2.0000 | Freq: Two times a day (BID) | NASAL | 0 refills | Status: DC

## 2020-12-17 MED ORDER — PANTOPRAZOLE SODIUM 40 MG PO TBEC: 80.0000 mg | DELAYED_RELEASE_TABLET | Freq: Two times a day (BID) | ORAL | 0 refills | Status: DC

## 2020-12-17 MED ORDER — MECLIZINE HCL 25 MG PO TABS: 25.0000 mg | ORAL_TABLET | Freq: Three times a day (TID) | ORAL | 0 refills | Status: DC | PRN

## 2020-12-17 MED ORDER — BUSPIRONE HCL 15 MG PO TABS
15.0000 mg | ORAL_TABLET | Freq: Three times a day (TID) | ORAL | 0 refills | Status: AC
Start: 2020-12-17 — End: ?

## 2020-12-17 MED ORDER — FAMOTIDINE 40 MG PO TABS: 40.0000 mg | ORAL_TABLET | Freq: Every day | ORAL | 0 refills | Status: AC

## 2020-12-17 MED ORDER — MECLIZINE HCL 25 MG PO TABS
25.0000 mg | ORAL_TABLET | Freq: Three times a day (TID) | ORAL | Status: DC | PRN
  Filled 2020-12-17: qty 1

## 2020-12-17 NOTE — Progress Notes (Signed)
Pt received discharge instructions and currently does not have any additional questions or concerns. Pt is ready for discharge.

## 2020-12-17 NOTE — Discharge Summary (Addendum)
Physician Discharge Summary  Chelsea Kerr [id number] DOB: [birth year] DOA: 12/10/2020  PCP: Isaias Sakai, DO  Admit date: 12/10/2020 Discharge date: 12/17/2020  Admitted From: home Discharge disposition: home   Recommendations for Outpatient Follow-Up:   1. Home health 2. Referral to outpatient [place] [facility] 3. outpateinet PFTs 4. BMP 1 week   Discharge Diagnosis:   Principal Problem:   Asthma exacerbation Active Problems:   Diabetes mellitus type 2 in [facility])    Discharge Condition: Improved.  Diet recommendation: Low sodium, heart healthy.  Carbohydrate-modified.    Wound care: None.  Code status: Full.   History of Present Illness:    SYNCERE GUITY a 65 y.o.femalewithhistory of asthma, type II diabetes mellitus, hyperlipidemia, hypothyroidism and chronic anxiety/depression presents to the ER with complaint of shortness of breath and dizziness. Patient has been having shortness of breath off and on for last 1 week with some semi-productive cough. Denies fever chills or chest pain. Patient states she has been compliant with her inhaler but still wheezing with dyspnea.   Hospital course complicated by persistent dyspnea with minimal exertion despite current therapies.  Pulmonary consulted, appreciate assistance. Says since her COVID infection in July she has "gone downhill" and "just wants her life back."- referral made to [facility]   Hospital Course by Problem:   Acute on chronic dyspnea likely multifactorial secondary to acute asthma exacerbation with significant component of reflux disease. Chest x-ray done on 12/10/2020: showing no lobular infiltrates or increase in pulmonary vascularity to suggest pulmonary edema, no active cardiopulmonary disease. She was started on IV Solu-Medrol in the ED, weaned off on 12/14/2020. Continue scheduled nebulizers, Pulmicort, and Singulair.  Pulmonary following, appreciate  assistance. Has received diuretics intermittently. Her oxygen saturation is stable, 96% on room air. Esophagram completed on 12/14/2020 shows no significant abnormality seen in the esophagus. Speech therapist consulted, MBS unrevealing -echo unrevealing -will need outpatient PFTs  Type 2 diabetes with hyperglycemia, exacerbated by high doses of IV Solu-Medrol, stopped on 12/14/2020. Hemoglobin A1c 6.7 on 12/12/2020 lantus/SSI Appreciate diabetes coordinator's assistance.  AKI -due to diuretics -hold any lasix  Diabetic neuropathy Continue home regimen  Hypothyroidism Continue home levothyroxine  Chronic anxiety/depression Continue home regimen  GERD Continue home PPI Protonix dose increased to 80 mg twice daily due to concern for significant reflux disease.  Hyperlipidemia Continue home Crestor  History of COVID-19 pneumonia in July 2021 Positive COVID-19 test on 06/21/2020 was admitted and treated for COVID-19 pneumonia. Repeated COVID-19 screening test on 12/10/2020   Physical debility/ambulatory dysfunction PT assessed and recommended home health PT  Dizziness Improved with vestibular PT    Medical Consultants:    PCCM  Discharge Exam:   Vitals:   12/17/20 0354 12/17/20 0726  BP: 120/69   Pulse: 62 (!) 59  Resp: 18 16  Temp: 98 F (36.7 C)   SpO2: 98% 97%   Vitals:   12/16/20 1700 12/16/20 2339 12/17/20 0354 12/17/20 0726  BP: 97/66 (!) 102/49 120/69   Pulse: (!) 55 64 62 (!) 59  Resp: 18 16 18 16   Temp: 97.8 F (36.6 C) 97.8 F (36.6 C) 98 F (36.7 C)   TempSrc: Oral Oral Oral   SpO2: 96% 98% 98% 97%  Weight:   81.7 kg   Height:        General exam: Appears calm and comfortable.   The results of significant diagnostics from this hospitalization (including imaging, microbiology, ancillary and laboratory) are listed  below for reference.     Procedures and Diagnostic Studies:   DG Chest 2 View  Result Date: 12/10/2020 CLINICAL  DATA:  Shortness of breath. EXAM: CHEST - 2 VIEW COMPARISON:  June 21, 2020. FINDINGS: The heart size and mediastinal contours are within normal limits. Both lungs are clear. No pneumothorax or pleural effusion is noted. The visualized skeletal structures are unremarkable. IMPRESSION: No active cardiopulmonary disease. Electronically Signed   By: Marijo Conception M.D.   On: 12/10/2020 13:31   CT Angio Chest PE W and/or Wo Contrast  Result Date: 12/10/2020 CLINICAL DATA:  Asthma exacerbation for the last week. Sudden worsening last night. EXAM: CT ANGIOGRAPHY CHEST WITH CONTRAST TECHNIQUE: Multidetector CT imaging of the chest was performed using the standard protocol during bolus administration of intravenous contrast. Multiplanar CT image reconstructions and MIPs were obtained to evaluate the vascular anatomy. CONTRAST:  20mL OMNIPAQUE IOHEXOL 350 MG/ML SOLN COMPARISON:  Chest x-ray 12/10/2020 FINDINGS: Cardiovascular: Satisfactory opacification of the pulmonary arteries to the segmental level. No evidence of pulmonary embolism. The main pulmonary artery is normal in caliber. Normal heart size. No significant pericardial effusion. The thoracic aorta is normal in caliber. Mild calcified and noncalcified atherosclerotic plaque of the thoracic aorta. No definite coronary artery calcifications. Incidentally noted replaced left vertebral artery with origin off of the aortic arch. Mediastinum/Nodes: Nonspecific 0.6 cm soft tissue density within the anterior mediastinum. No enlarged mediastinal, hilar, or axillary lymph nodes. Prominent but nonenlarged paraesophageal lymph node (5:98). Thyroid gland, trachea, and esophagus demonstrate no significant findings. Lungs/Pleura: No focal consolidation. Linear atelectasis versus scarring within the lingula and left lower lobe. No pulmonary nodule. No pulmonary mass. No pleural effusion. No pneumothorax. Diffuse mild bronchial wall thickening. Upper Abdomen: No acute  abnormality. Musculoskeletal: No chest wall abnormality No suspicious lytic or blastic osseous lesions. No acute displaced fracture. Multilevel degenerative changes of the spine. Degenerative changes of bilateral partially visualized shoulders. Review of the MIP images confirms the above findings. IMPRESSION: 1. No pulmonary embolus. 2. Diffuse mild bronchial wall thickening consistent with given history of asthma. Electronically Signed   By: Iven Finn M.D.   On: 12/10/2020 21:23     Labs:   Basic Metabolic Panel: Recent Labs  Lab 12/12/20 1202 12/13/20 0758 12/15/20 0417 12/16/20 0326 12/17/20 0405  NA 136 132* 138 136 137  K 4.3 4.2 4.2 4.1 4.1  CL 102 99 103 104 106  CO2 20* 20* 25 20* 22  GLUCOSE 237* 414* 140* 158* 139*  BUN 22 32* 38* 39* 36*  CREATININE 1.39* 1.39* 1.51* 1.47* 1.37*  CALCIUM 9.6 9.3 9.6 9.2 9.1  MG 1.8 1.8  --   --   --   PHOS  --  4.2  --   --   --    GFR Estimated Creatinine Clearance: 41.6 mL/min (A) (by C-G formula based on SCr of 1.37 mg/dL (H)). Liver Function Tests: No results for input(s): AST, ALT, ALKPHOS, BILITOT, PROT, ALBUMIN in the last 168 hours. No results for input(s): LIPASE, AMYLASE in the last 168 hours. No results for input(s): AMMONIA in the last 168 hours. Coagulation profile No results for input(s): INR, PROTIME in the last 168 hours.  CBC: Recent Labs  Lab 12/10/20 1313 12/11/20 0322 12/11/20 0428 12/15/20 0417  WBC 11.3* 11.3*  --  10.5  HGB 12.5 12.2 11.9* 12.6  HCT 37.3 36.6 35.0* 39.2  MCV 87.4 89.1  --  89.9  PLT 357 363  --  352   Cardiac Enzymes: No results for input(s): CKTOTAL, CKMB, CKMBINDEX, TROPONINI in the last 168 hours. BNP: Invalid input(s): POCBNP CBG: Recent Labs  Lab 12/16/20 0549 12/16/20 1119 12/16/20 1629 12/16/20 2106 12/17/20 0603  GLUCAP 121* 123* 84 111* 130*   D-Dimer No results for input(s): DDIMER in the last 72 hours. Hgb A1c No results for input(s): HGBA1C in the last  72 hours. Lipid Profile No results for input(s): CHOL, HDL, LDLCALC, TRIG, CHOLHDL, LDLDIRECT in the last 72 hours. Thyroid function studies No results for input(s): TSH, T4TOTAL, T3FREE, THYROIDAB in the last 72 hours.  Invalid input(s): FREET3 Anemia work up No results for input(s): VITAMINB12, FOLATE, FERRITIN, TIBC, IRON, RETICCTPCT in the last 72 hours. Microbiology Recent Results (from the past 240 hour(s))  Resp Panel by RT-PCR (Flu A&B, Covid) Nasopharyngeal Swab     Status: None   Collection Time: 12/10/20 11:49 PM   Specimen: Nasopharyngeal Swab; Nasopharyngeal(NP) swabs in vial transport medium  Result Value Ref Range Status   SARS Coronavirus 2 by RT PCR NEGATIVE NEGATIVE Final    Comment: (NOTE) SARS-CoV-2 target nucleic acids are NOT DETECTED.  The SARS-CoV-2 RNA is generally detectable in upper respiratory specimens during the acute phase of infection. The lowest concentration of SARS-CoV-2 viral copies this assay can detect is 138 copies/mL. A negative result does not preclude SARS-Cov-2 infection and should not be used as the sole basis for treatment or other patient management decisions. A negative result may occur with  improper specimen collection/handling, submission of specimen other than nasopharyngeal swab, presence of viral mutation(s) within the areas targeted by this assay, and inadequate number of viral copies(<138 copies/mL). A negative result must be combined with clinical observations, patient history, and epidemiological information. The expected result is Negative.  Fact Sheet for Patients:  EntrepreneurPulse.com.au  Fact Sheet for Healthcare Providers:  IncredibleEmployment.be  This test is no t yet approved or cleared by the [place] FDA and  has been authorized for detection and/or diagnosis of SARS-CoV-2 by FDA under an Emergency Use Authorization (EUA). This EUA will remain  in effect (meaning this  test can be used) for the duration of the COVID-19 declaration under Section 564(b)(1) of the Act, 21 U.S.C.section 360bbb-3(b)(1), unless the authorization is terminated  or revoked sooner.       Influenza A by PCR NEGATIVE NEGATIVE Final   Influenza B by PCR NEGATIVE NEGATIVE Final    Comment: (NOTE) The Xpert Xpress SARS-CoV-2/FLU/RSV plus assay is intended as an aid in the diagnosis of influenza from Nasopharyngeal swab specimens and should not be used as a sole basis for treatment. Nasal washings and aspirates are unacceptable for Xpert Xpress SARS-CoV-2/FLU/RSV testing.  Fact Sheet for Patients: EntrepreneurPulse.com.au  Fact Sheet for Healthcare Providers: IncredibleEmployment.be  This test is not yet approved or cleared by the [place] FDA and has been authorized for detection and/or diagnosis of SARS-CoV-2 by FDA under an Emergency Use Authorization (EUA). This EUA will remain in effect (meaning this test can be used) for the duration of the COVID-19 declaration under Section 564(b)(1) of the Act, 21 U.S.C. section 360bbb-3(b)(1), unless the authorization is terminated or revoked.  Performed at [facility], [place] [address], [place], [place] [address]      Discharge Instructions:   Discharge Instructions    Diet - low sodium heart healthy   Complete by: As directed    Diet Carb Modified   Complete by: As directed    Discharge instructions  Complete by: As directed    Home health Pulm follow up outpatient   Increase activity slowly   Complete by: As directed      Allergies as of 12/17/2020   No Known Allergies     Medication List    STOP taking these medications   budesonide-formoterol 160-4.5 MCG/ACT inhaler Commonly known as: SYMBICORT   estradiol 1 MG tablet Commonly known as: ESTRACE   lisinopril-hydrochlorothiazide 20-25 MG tablet Commonly known as: ZESTORETIC     TAKE these medications    albuterol 108 (90 Base) MCG/ACT inhaler Commonly known as: VENTOLIN HFA Inhale 2 puffs into the lungs every 4 (four) hours as needed for wheezing or shortness of breath. For shortness of breath   ALPRAZolam 1 MG tablet Commonly known as: XANAX Take 1 mg by mouth 3 (three) times daily as needed for anxiety.   buPROPion 300 MG 24 hr tablet Commonly known as: WELLBUTRIN XL Take 300 mg by mouth daily.   busPIRone 15 MG tablet Commonly known as: BUSPAR Take 1 tablet (15 mg total) by mouth 3 (three) times daily. What changed:   medication strength  how much to take   cholecalciferol 1000 units tablet Commonly known as: VITAMIN D Take 1 tablet (1,000 Units total) by mouth 2 (two) times daily. What changed: when to take this   cyclobenzaprine 10 MG tablet Commonly known as: FLEXERIL Take 10 mg by mouth 3 (three) times daily as needed for muscle spasms.   famotidine 40 MG tablet Commonly known as: PEPCID Take 1 tablet (40 mg total) by mouth at bedtime.   fluticasone 50 MCG/ACT nasal spray Commonly known as: FLONASE Place 2 sprays into both nostrils 2 (two) times daily.   Fluticasone-Salmeterol 500-50 MCG/DOSE Aepb Commonly known as: ADVAIR Inhale 1 puff into the lungs 2 (two) times daily.   gabapentin 600 MG tablet Commonly known as: NEURONTIN Take 0.5 tablets (300 mg total) by mouth 3 (three) times daily. What changed: how much to take   gemfibrozil 600 MG tablet Commonly known as: LOPID Take 1 tablet (600 mg total) by mouth 2 (two) times daily before a meal.   glimepiride 2 MG tablet Commonly known as: AMARYL Take 1 tablet (2 mg total) by mouth daily with breakfast.   levothyroxine 125 MCG tablet Commonly known as: SYNTHROID Take 125 mcg by mouth daily.   meclizine 25 MG tablet Commonly known as: ANTIVERT Take 1 tablet (25 mg total) by mouth 3 (three) times daily as needed for dizziness.   meloxicam 15 MG tablet Commonly known as: MOBIC Take 15 mg by mouth  daily.   oyster calcium 500 MG Tabs tablet Take 500 mg of elemental calcium by mouth 2 (two) times daily.   pantoprazole 40 MG tablet Commonly known as: PROTONIX Take 2 tablets (80 mg total) by mouth 2 (two) times daily. What changed: how much to take   rosuvastatin 10 MG tablet Commonly known as: CRESTOR Take 10 mg by mouth at bedtime.   sucralfate 1 g tablet Commonly known as: CARAFATE Take 1 g by mouth 2 (two) times daily.   Vitamin C 500 MG Chew Chew 2 tablets by mouth daily.   zafirlukast 20 MG tablet Commonly known as: ACCOLATE Take 20 mg by mouth 2 (two) times daily.   Zinc 220 (50 Zn) MG Caps Take 220 mg by mouth daily.   zolpidem 10 MG tablet Commonly known as: AMBIEN Take 10 mg by mouth at bedtime as needed. For sleep  Durable Medical Equipment  (From admission, onward)         Start     Ordered   12/13/20 1737  For home use only DME 3 n 1  Once        12/13/20 1736   12/13/20 1736  For home use only DME Walker rolling  Once       Question Answer Comment  Walker: With 5 Inch Wheels   Patient needs a walker to treat with the following condition Ambulatory dysfunction      12/13/20 1736          Follow-up Information    [facility]: Jan 18th at 1:30 PM Follow up.   Contact information: [place], [facility] Follow up.   Specialty: Home Health Services Contact information: [place] [phone]        Laurin Coder, MD Follow up.   Specialty: Pulmonary Disease Contact information: [place] [address] [phone]                Time coordinating discharge: 35 min  Signed:  Geradine Girt DO  [facility] 12/17/2020, 10:37 AM

## 2020-12-21 ENCOUNTER — Inpatient Hospital Stay: Payer: Medicare Other

## 2021-03-25 ENCOUNTER — Other Ambulatory Visit: Payer: Self-pay | Admitting: Physician Assistant

## 2021-03-25 DIAGNOSIS — Z1231 Encounter for screening mammogram for malignant neoplasm of breast: Secondary | ICD-10-CM

## 2021-05-13 ENCOUNTER — Ambulatory Visit: Payer: Medicare Other

## 2021-06-30 ENCOUNTER — Ambulatory Visit: Payer: Medicare Other

## 2021-08-13 IMAGING — RF DG ESOPHAGUS
5 series · 20 of 20 positions shown · non-contrast
Comparison: October 21, 2018.

CLINICAL DATA: Dysphagia.

EXAM:
ESOPHOGRAM/BARIUM SWALLOW
TECHNIQUE: Single contrast examination was performed using thin barium. Exam
was somewhat limited as the patient could not stand and the exam was
performed in nearly supine position. As result, barium tablet was
not administered.
FLUOROSCOPY TIME:  Radiation Exposure Index (if provided by the
fluoroscopic device): 34.3 mGy.

[Series 1: cp_standard · 0.51mm/px · 4 of 94 frames shown (1 of 5)]
[frame 15/94]
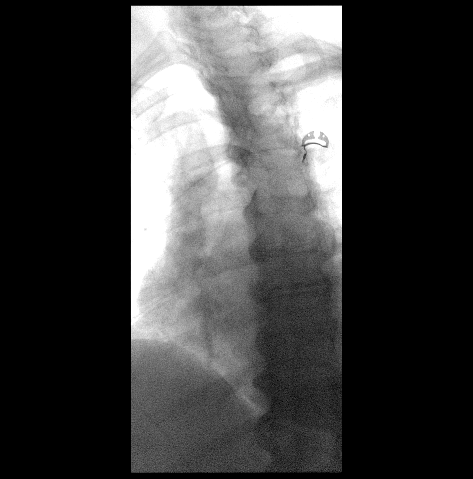
[frame 48/94]
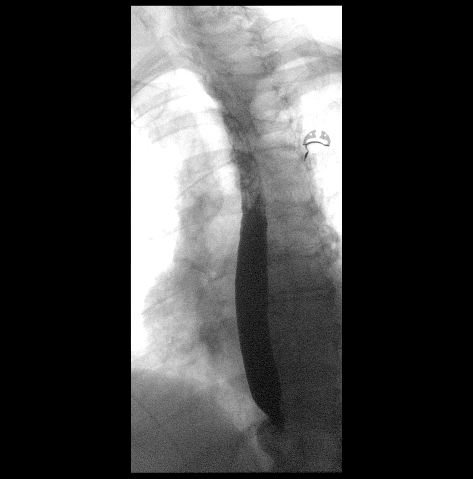
[frame 80/94]
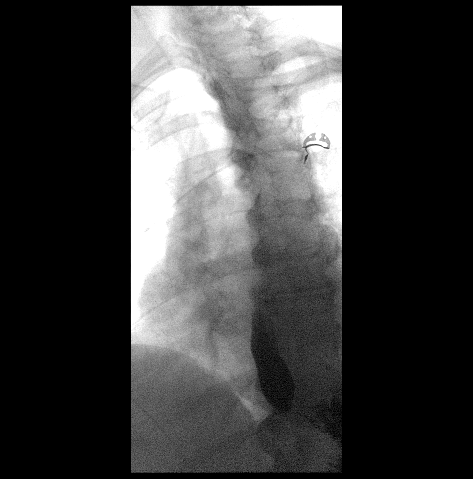
[frame 94/94]
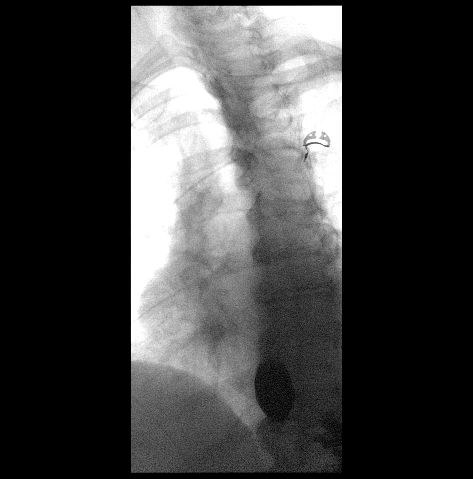

[Series 2: cp_standard · 0.51mm/px · 4 of 166 frames shown (2 of 5)]
[frame 25/166]
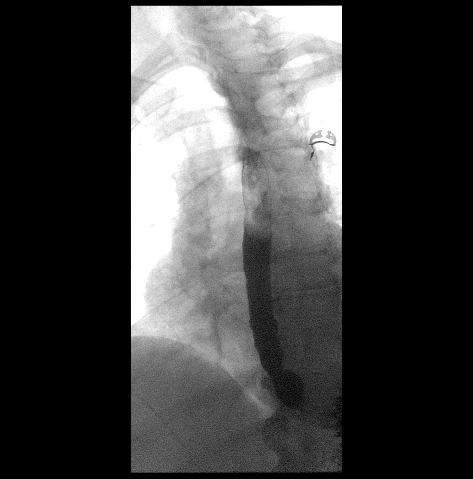
[frame 84/166]
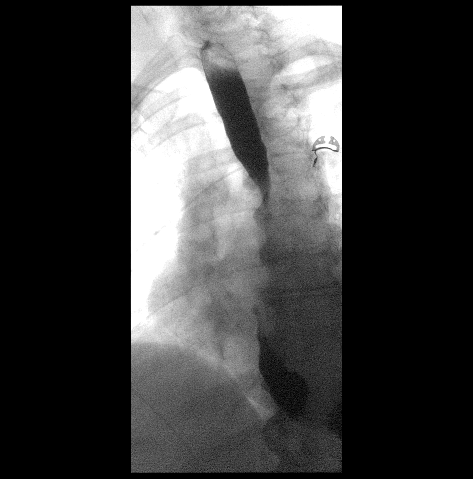
[frame 142/166]
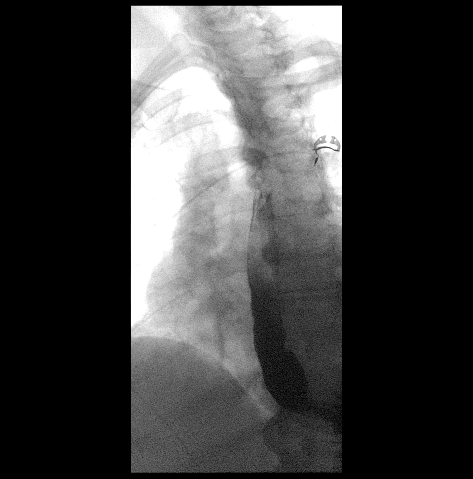
[frame 163/166]
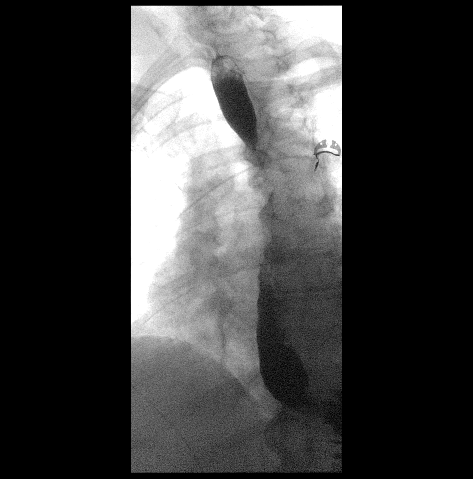

[Series 3: cp_standard · 0.51mm/px · 4 of 108 frames shown (3 of 5)]
[frame 17/108]
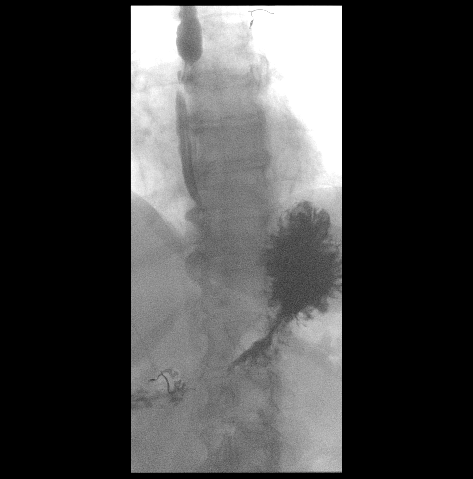
[frame 55/108]
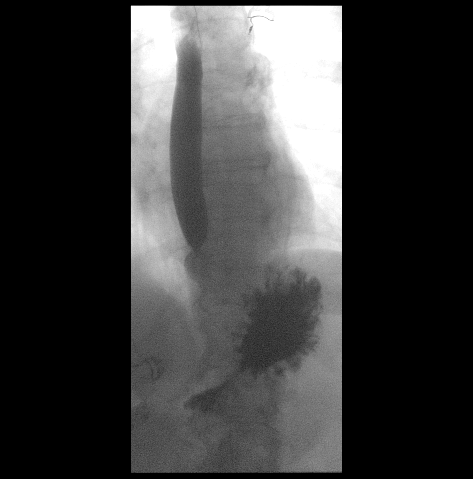
[frame 76/108]
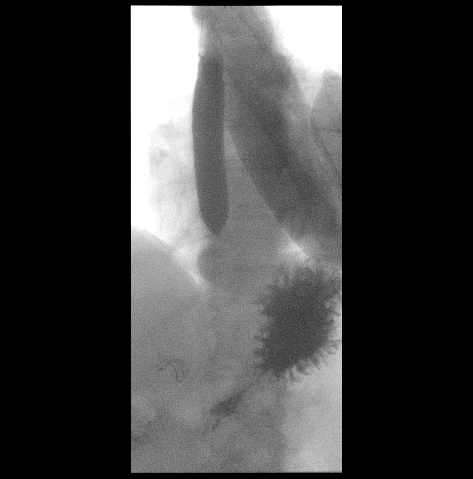
[frame 92/108]
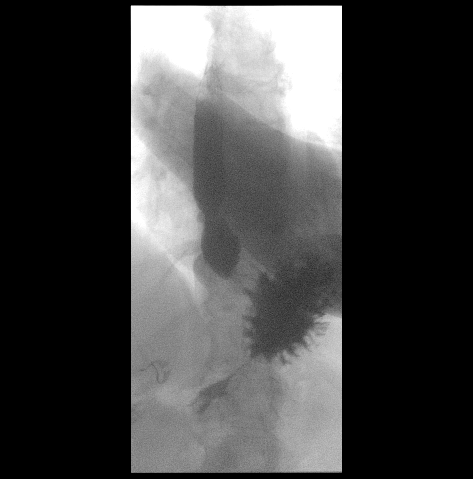

[Series 4: cp_standard · 0.51mm/px · 4 of 247 frames shown (4 of 5)]
[frame 2/247]
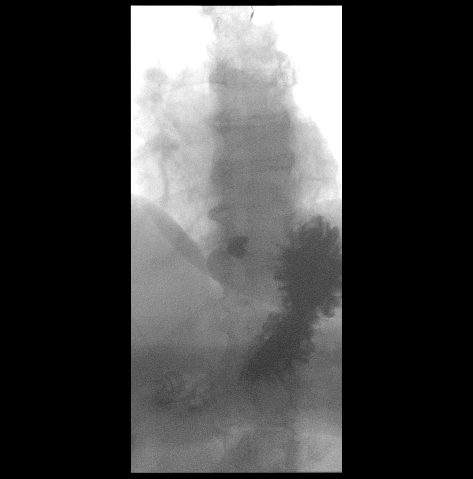
[frame 38/247]
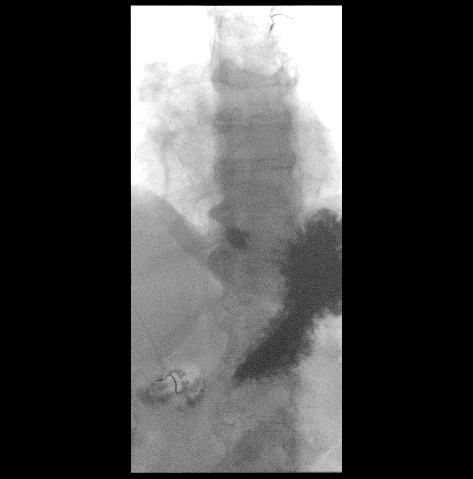
[frame 124/247]
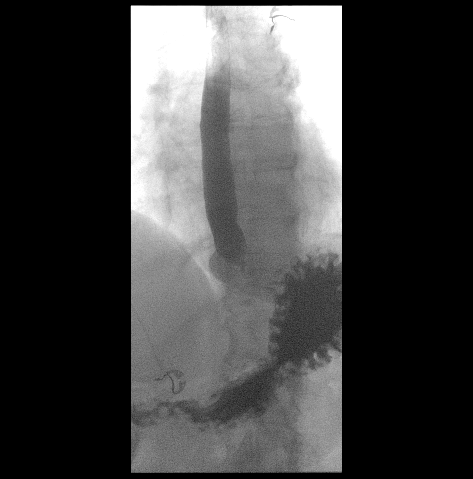
[frame 210/247]
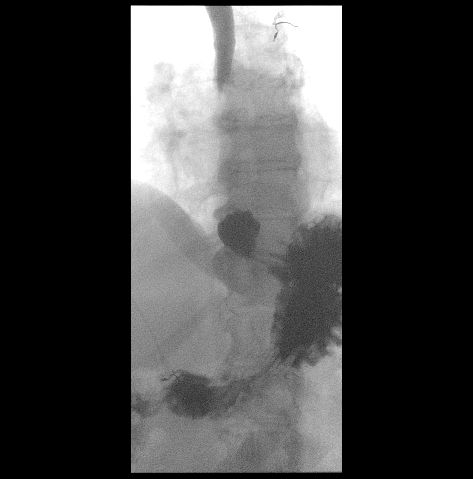

[Series 5: cp_standard · 0.51mm/px · 4 of 289 frames shown (5 of 5)]
[frame 44/289]
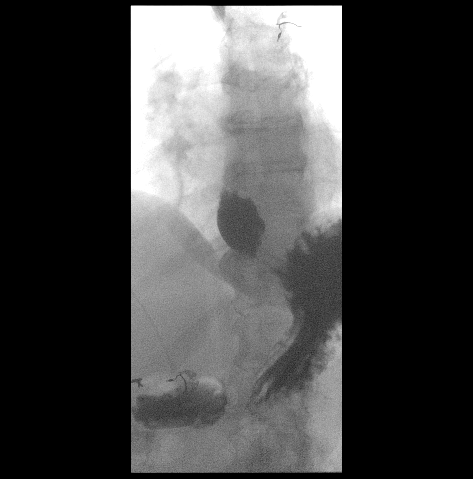
[frame 96/289]
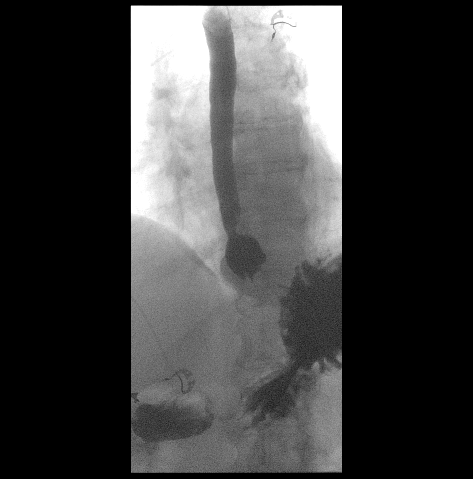
[frame 145/289]
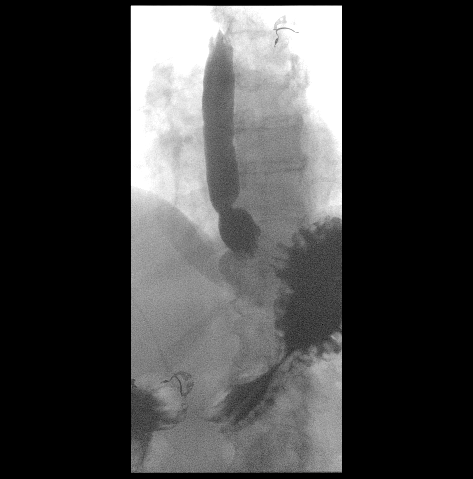
[frame 246/289]
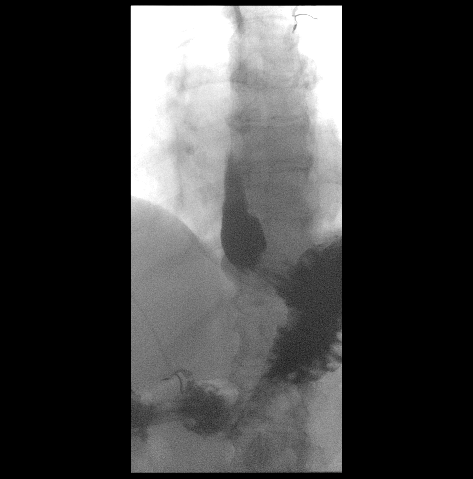

[20 of 20 positions shown; findings below may reference images not displayed]

FINDINGS: Minimal tertiary contractions were noted suggesting minimal
presbyesophagus. No definite mass or stricture is noted. No definite
hiatal hernia or reflux was noted.
IMPRESSION: No significant abnormality seen in the esophagus.

## 2021-11-04 ENCOUNTER — Ambulatory Visit: Payer: Medicare Other

## 2022-01-03 LAB — COLOGUARD: COLOGUARD: NEGATIVE

## 2022-07-10 ENCOUNTER — Ambulatory Visit (HOSPITAL_COMMUNITY)
Admission: RE | Admit: 2022-07-10 | Discharge: 2022-07-10 | Disposition: A | Discharge status: Home / Self Care | Payer: Medicare Other | Source: Ambulatory Visit | Attending: Urology | Admitting: Urology

## 2022-07-10 ENCOUNTER — Other Ambulatory Visit (HOSPITAL_COMMUNITY): Payer: Self-pay | Admitting: Urology

## 2022-07-10 DIAGNOSIS — I82220 Acute embolism and thrombosis of inferior vena cava: Secondary | ICD-10-CM | POA: Diagnosis present

## 2022-07-10 DIAGNOSIS — D49511 Neoplasm of unspecified behavior of right kidney: Secondary | ICD-10-CM | POA: Insufficient documentation

## 2022-08-29 NOTE — Progress Notes (Unsigned)
Cardiology Office Note:    Date:  08/30/2022   ID:  Chelsea Kerr, DOB [birth year], MRN [id number]  PCP:  Chelsea Kerr, [place] [facility] Cardiologist:  Chelsea Sciara, MD Referring MD: Chelsea Frock, MD   Chief Complaint/Reason for Referral: Preoperative evaluation  ASSESSMENT:    1. Preoperative cardiovascular examination   2. Prediabetes   3. Hypertension associated with diabetes ([place])   4. Hyperlipidemia associated with type 2 diabetes mellitus (HCC)   5. Stage 3 chronic kidney disease, unspecified whether stage 3a or 3b CKD ([place])   6. Aortic atherosclerosis (HCC)   7. Palpitations     PLAN:    In order of problems listed above: 1.  Preoperative evaluation: I will obtain an echocardiogram and nuclear stress test to evaluate further.  Only if these are markedly abnormal with this need to work be followed by further cardiac evaluation. 2.  Prediabetes: Monitor for now. 3.  Hypertension: Blood pressures well controlled on her current regimen. 4.  Hyperlipidemia: Goal LDL is less than 70.  This is being followed by the patient's primary care provider. 5.  Stage III chronic kidney disease: Monitor for now; if becomes diabetic consider ACE or ARB. 6.  Aortic atherosclerosis: Continue statin and aspirin, and strict blood pressure control. 7.  Palpitations: We will obtain a 3-day monitor to evaluate further.         Shared Decision Making/Informed Consent The risks [chest pain, shortness of breath, cardiac arrhythmias, dizziness, blood pressure fluctuations, myocardial infarction, stroke/transient ischemic attack, nausea, vomiting, allergic reaction, radiation exposure, metallic taste sensation and life-threatening complications (estimated to be 1 in 10,000)], benefits (risk stratification, diagnosing coronary artery disease, treatment guidance) and alternatives of a nuclear stress test were discussed in detail with Chelsea Kerr and she agrees to proceed.    Dispo:  Return in about 9 months (around 05/31/2023).      Medication Adjustments/Labs and Tests Ordered: Current medicines are reviewed at length with the patient today.  Concerns regarding medicines are outlined above.  The following changes have been made:  no change   Labs/tests ordered: Orders Placed This Encounter  Procedures   Cardiac Stress Test: Informed Consent Details: Physician/Practitioner Attestation; Transcribe to consent form and obtain patient signature   MYOCARDIAL PERFUSION IMAGING   LONG TERM MONITOR (3-14 DAYS)   EKG 12-Lead   ECHOCARDIOGRAM COMPLETE    Medication Changes: No orders of the defined types were placed in this encounter.    Current medicines are reviewed at length with the patient today.  The patient does not have concerns regarding medicines.   History of Present Illness:    FOCUSED PROBLEM LIST:   1.  Prediabetic 2.  Hypertension 3.  Hyperlipidemia 4.  Chronic kidney disease stage III 5.  COPD/asthma 6.  Aortic atherosclerosis on chest CT 2022  The patient is a 66 y.o. female with the indicated medical history here for preoperative cardiovascular examination.  The patient was found to have a renal mass and is to undergo a nephrectomy.  She was seen recently by her care team and was noted to be dyspneic on exertion as well.  She tells me she had an episode where she felt lightheaded and had palpitations.  This was associated with some chest discomfort.  On a regular basis she does not get the symptoms.  She does sleep with multiple pillows for comfort and not so much for breathing difficulties.  She has noticed some unilateral leg swelling of her  right lower extremity.  She has had no severe bleeding or bruising.  She fortunately is not required any emergency room visits or hospitalizations.  She denies any significant issues with her medications.  Current Medications: Current Meds  Medication Sig   albuterol (PROVENTIL HFA;VENTOLIN HFA)  108 (90 BASE) MCG/ACT inhaler Inhale 2 puffs into the lungs every 4 (four) hours as needed for wheezing or shortness of breath. For shortness of breath   ALPRAZolam (XANAX) 1 MG tablet Take 1 mg by mouth 3 (three) times daily as needed for anxiety.   Ascorbic Acid (VITAMIN C) 500 MG CHEW Chew 2 tablets by mouth daily.   aspirin EC 81 MG tablet Take 81 mg by mouth daily. Swallow whole.   buPROPion (WELLBUTRIN XL) 300 MG 24 hr tablet Take 300 mg by mouth daily.   busPIRone (BUSPAR) 15 MG tablet Take 1 tablet (15 mg total) by mouth 3 (three) times daily.   cholecalciferol (VITAMIN D) 1000 units tablet Take 1 tablet (1,000 Units total) by mouth 2 (two) times daily.   cyclobenzaprine (FLEXERIL) 10 MG tablet Take 10 mg by mouth 3 (three) times daily as needed for muscle spasms.   famotidine (PEPCID) 40 MG tablet Take 1 tablet (40 mg total) by mouth at bedtime.   fenofibrate (TRICOR) 145 MG tablet Take 145 mg by mouth daily.   fluticasone (FLONASE) 50 MCG/ACT nasal spray Place 2 sprays into both nostrils 2 (two) times daily.   Fluticasone-Salmeterol (ADVAIR) 500-50 MCG/DOSE AEPB Inhale 1 puff into the lungs 2 (two) times daily.   gabapentin (NEURONTIN) 600 MG tablet Take 0.5 tablets (300 mg total) by mouth 3 (three) times daily.   gemfibrozil (LOPID) 600 MG tablet Take 1 tablet (600 mg total) by mouth 2 (two) times daily before a meal.   glimepiride (AMARYL) 2 MG tablet Take 1 tablet (2 mg total) by mouth daily with breakfast.   levothyroxine (SYNTHROID) 175 MCG tablet Take 175 mcg by mouth daily.   lisinopril-hydrochlorothiazide (ZESTORETIC) 20-25 MG tablet Take 1 tablet by mouth daily.   meclizine (ANTIVERT) 25 MG tablet Take 1 tablet (25 mg total) by mouth 3 (three) times daily as needed for dizziness.   meloxicam (MOBIC) 15 MG tablet Take 15 mg by mouth daily.   omega-3 acid ethyl esters (LOVAZA) 1 g capsule Take 4 capsules by mouth daily.   Oyster Shell (OYSTER CALCIUM) 500 MG TABS tablet Take 500  mg of elemental calcium by mouth 2 (two) times daily.   pantoprazole (PROTONIX) 40 MG tablet Take 2 tablets (80 mg total) by mouth 2 (two) times daily.   rosuvastatin (CRESTOR) 10 MG tablet Take 10 mg by mouth at bedtime.   sucralfate (CARAFATE) 1 g tablet Take 1 g by mouth 2 (two) times daily.   zafirlukast (ACCOLATE) 20 MG tablet Take 20 mg by mouth 2 (two) times daily.   Zinc 220 (50 Zn) MG CAPS Take 220 mg by mouth daily.   zolpidem (AMBIEN) 10 MG tablet Take 10 mg by mouth at bedtime as needed. For sleep     Allergies:    Patient has no known allergies.   Social History:   Social History   Tobacco Use   Smoking status: Never   Smokeless tobacco: Never  Vaping Use   Vaping Use: Never used  Substance Use Topics   Alcohol use: No   Drug use: No     Family Hx: Family History  Problem Relation Age of Onset   Heart failure Mother  Heart failure Father      Review of Systems:   Please see the history of present illness.    All other systems reviewed and are negative.     EKGs/Labs/Other Test Reviewed:    EKG:  EKG performed January 2022 that I personally reviewed demonstrates normal sinus rhythm; EKG performed today that I personally reviewed demonstrates sinus rhythm.  Prior CV studies:  TTE 2022 1. Abnormal septal motion . Left ventricular ejection fraction, by  estimation, is 55 to 60%. The left ventricle has normal function. The left  ventricle has no regional wall motion abnormalities. Left ventricular  diastolic parameters were normal.   2. Right ventricular systolic function is normal. The right ventricular  size is normal.   3. The mitral valve is normal in structure. No evidence of mitral valve  regurgitation. No evidence of mitral stenosis.   4. The aortic valve is normal in structure. Aortic valve regurgitation is  not visualized. No aortic stenosis is present.   5. The inferior vena cava is normal in size with greater than 50%  respiratory  variability, suggesting right atrial pressure of 3 mmHg.   Other studies Reviewed: Review of the additional studies/records demonstrates: Chest CT 2022 with aortic atherosclerosis without coronary artery calcification  Recent Labs: No results found for requested labs within last 365 days.   Recent Lipid Panel Lab Results  Component Value Date/Time   TRIG 573 (H) 06/21/2020 06:37 PM    Risk Assessment/Calculations:           HYPERTENSION CONTROL Vitals:   08/30/22 1532 08/30/22 1639  BP: (!) 140/80 (!) 145/75    The patient's blood pressure is elevated above target today.  In order to address the patient's elevated BP: Blood pressure will be monitored at home to determine if medication changes need to be made.       Physical Exam:    VS:  BP (!) 145/75   Pulse 83   Ht '5\' 2"'$  (1.575 m)   Wt 194 lb (88 kg)   SpO2 96%   BMI 35.48 kg/m    Wt Readings from Last 3 Encounters:  08/30/22 194 lb (88 kg)  12/17/20 180 lb 1.6 oz (81.7 kg)  06/21/20 198 lb (89.8 kg)    GENERAL:  No apparent distress, AOx3 HEENT:  No carotid bruits, +2 carotid impulses, no scleral icterus CAR: RRR no murmurs, gallops, rubs, or thrills RES:  Clear to auscultation bilaterally ABD:  Soft, nontender, nondistended, positive bowel sounds x 4 VASC:  +2 radial pulses, +2 carotid pulses, palpable pedal pulses NEURO:  CN 2-12 grossly intact; motor and sensory grossly intact PSYCH:  No active depression or anxiety EXT:  No edema, ecchymosis, or cyanosis  Signed, Early Osmond, MD  08/30/2022 5:10 PM    [address], [place], [place]  [address] Phone: [phone]; Fax: [phone]   Note:  This document was prepared using Dragon voice recognition software and may include unintentional dictation errors.

## 2022-08-30 ENCOUNTER — Encounter: Payer: Self-pay | Admitting: *Deleted

## 2022-08-30 ENCOUNTER — Encounter: Payer: Self-pay | Admitting: Internal Medicine

## 2022-08-30 ENCOUNTER — Ambulatory Visit: Payer: Medicare Other | Attending: Internal Medicine | Admitting: Internal Medicine

## 2022-08-30 ENCOUNTER — Ambulatory Visit (INDEPENDENT_AMBULATORY_CARE_PROVIDER_SITE_OTHER): Payer: Medicare Other

## 2022-08-30 VITALS — BP 145/75 | HR 83 | Ht 62.0 in | Wt 194.0 lb

## 2022-08-30 DIAGNOSIS — E1169 Type 2 diabetes mellitus with other specified complication: Secondary | ICD-10-CM | POA: Diagnosis not present

## 2022-08-30 DIAGNOSIS — R7303 Prediabetes: Secondary | ICD-10-CM | POA: Diagnosis not present

## 2022-08-30 DIAGNOSIS — I700 Atherosclerosis of aorta: Secondary | ICD-10-CM

## 2022-08-30 DIAGNOSIS — R002 Palpitations: Secondary | ICD-10-CM

## 2022-08-30 DIAGNOSIS — Z01810 Encounter for preprocedural cardiovascular examination: Secondary | ICD-10-CM

## 2022-08-30 DIAGNOSIS — I152 Hypertension secondary to endocrine disorders: Secondary | ICD-10-CM

## 2022-08-30 DIAGNOSIS — E1159 Type 2 diabetes mellitus with other circulatory complications: Secondary | ICD-10-CM | POA: Diagnosis not present

## 2022-08-30 DIAGNOSIS — N183 Chronic kidney disease, stage 3 unspecified: Secondary | ICD-10-CM

## 2022-08-30 DIAGNOSIS — E785 Hyperlipidemia, unspecified: Secondary | ICD-10-CM

## 2022-08-30 DIAGNOSIS — Z794 Long term (current) use of insulin: Secondary | ICD-10-CM

## 2022-08-30 NOTE — Progress Notes (Unsigned)
Enrolled for [employer] to mail a ZIO XT long term holter monitor to the patients address on file.  

## 2022-08-30 NOTE — Patient Instructions (Addendum)
Medication Instructions:  No changes *If you need a refill on your cardiac medications before your next appointment, please call your pharmacy*   Lab Work: none   Testing/Procedures: Your physician has requested that you have an echocardiogram. Echocardiography is a painless test that uses sound waves to create images of your heart. It provides your doctor with information about the size and shape of your heart and how well your heart's chambers and valves are working. This procedure takes approximately one hour. There are no restrictions for this procedure.  Your physician has requested that you have a lexiscan myoview. For further information please visit [email or web address]. Please follow instruction sheet, as given.   Follow-Up: At [facility], you and your health needs are our priority.  As part of our continuing mission to provide you with exceptional heart care, we have created designated Provider Care Teams.  These Care Teams include your primary Cardiologist (physician) and Advanced Practice Providers (APPs -  Physician Assistants and Nurse Practitioners) who all work together to provide you with the care you need, when you need it.  We recommend signing up for the patient portal called "MyChart".  Sign up information is provided on this After Visit Summary.  MyChart is used to connect with patients for Virtual Visits (Telemedicine).  Patients are able to view lab/test results, encounter notes, upcoming appointments, etc.  Non-urgent messages can be sent to your provider as well.   To learn more about what you can do with MyChart, go to [email or web address].    Your next appointment:   9 month(s)  The format for your next appointment:   In Person  Provider:   Lenna Sciara, MD  [place] Monitor Instructions  Your physician has requested you wear a ZIO patch monitor for 3 days.  This is a single patch monitor. [employer] supplies one patch monitor per  enrollment. Additional stickers are not available. Please do not apply patch if you will be having a Nuclear Stress Test,  Echocardiogram, Cardiac CT, MRI, or Chest Xray during the period you would be wearing the  monitor. The patch cannot be worn during these tests. You cannot remove and re-apply the  ZIO XT patch monitor.  Your ZIO patch monitor will be mailed 3 day USPS to your address on file. It may take 3-5 days  to receive your monitor after you have been enrolled.  Once you have received your monitor, please review the enclosed instructions. Your monitor  has already been registered assigning a specific monitor serial # to you.  Billing and Patient Assistance Program Information  We have supplied [employer] with any of your insurance information on file for billing purposes. [employer] offers a sliding scale Patient Assistance Program for patients that do not have  insurance, or whose insurance does not completely cover the cost of the ZIO monitor.  You must apply for the Patient Assistance Program to qualify for this discounted rate.  To apply, please call [employer] at [phone], select option 4, select option 2, ask to apply for  Patient Assistance Program. Theodore Demark will ask your household income, and how many people  are in your household. They will quote your out-of-pocket cost based on that information.  [employer] will also be able to set up a 54-month interest-free payment plan if needed.  Applying the monitor   Shave hair from upper left chest.  Hold abrader disc by orange tab. Rub abrader in 40 strokes over the upper left chest as  indicated  in your monitor instructions.  Clean area with 4 enclosed alcohol pads. Let dry.  Apply patch as indicated in monitor instructions. Patch will be placed under collarbone on left  side of chest with arrow pointing upward.  Rub patch adhesive wings for 2 minutes. Remove white label marked "1". Remove the white  label marked "2". Rub patch  adhesive wings for 2 additional minutes.  While looking in a mirror, press and release button in center of patch. A small green light will  flash 3-4 times. This will be your only indicator that the monitor has been turned on.  Do not shower for the first 24 hours. You may shower after the first 24 hours.  Press the button if you feel a symptom. You will hear a small click. Record Date, Time and  Symptom in the Patient Logbook.  When you are ready to remove the patch, follow instructions on the last 2 pages of Patient  Logbook. Stick patch monitor onto the last page of Patient Logbook.  Place Patient Logbook in the blue and white box. Use locking tab on box and tape box closed  securely. The blue and white box has prepaid postage on it. Please place it in the mailbox as  soon as possible. Your physician should have your test results approximately 7 days after the  monitor has been mailed back to [facility].  Call [facility] at [phone] if you have questions regarding  your ZIO XT patch monitor. Call them immediately if you see an orange light blinking on your  monitor.  If your monitor falls off in less than 4 days, contact our Monitor department at [phone].  If your monitor becomes loose or falls off after 4 days call [employer] at [phone] for  suggestions on securing your monitor

## 2022-08-31 ENCOUNTER — Telehealth (HOSPITAL_COMMUNITY): Payer: Self-pay

## 2022-08-31 NOTE — Telephone Encounter (Signed)
Detailed instructions left on the patient's answering machine. Asked to call back with any questions. S.Efosa Treichler EMTP 

## 2022-09-01 ENCOUNTER — Ambulatory Visit (HOSPITAL_COMMUNITY): Payer: Medicare Other | Attending: Cardiology

## 2022-09-01 ENCOUNTER — Encounter (HOSPITAL_COMMUNITY): Payer: Medicare Other

## 2022-09-01 ENCOUNTER — Ambulatory Visit (HOSPITAL_BASED_OUTPATIENT_CLINIC_OR_DEPARTMENT_OTHER): Payer: Medicare Other

## 2022-09-01 DIAGNOSIS — R002 Palpitations: Secondary | ICD-10-CM

## 2022-09-01 DIAGNOSIS — Z01810 Encounter for preprocedural cardiovascular examination: Secondary | ICD-10-CM | POA: Diagnosis not present

## 2022-09-01 LAB — MYOCARDIAL PERFUSION IMAGING
LV dias vol: 65 mL (ref 46–106)
LV sys vol: 19 mL
Nuc Stress EF: 71 %
Peak HR: 100 {beats}/min
Rest HR: 80 {beats}/min
Rest Nuclear Isotope Dose: 10.2 mCi
SDS: 2
SRS: 2
SSS: 4
ST Depression (mm): 0 mm
Stress Nuclear Isotope Dose: 32.6 mCi
TID: 0.88

## 2022-09-01 MED ORDER — TECHNETIUM TC 99M TETROFOSMIN IV KIT
32.6000 | PACK | Freq: Once | INTRAVENOUS | Status: AC | PRN
  Administered 2022-09-01: 32.6 via INTRAVENOUS

## 2022-09-01 MED ORDER — REGADENOSON 0.4 MG/5ML IV SOLN
0.4000 mg | Freq: Once | INTRAVENOUS | Status: AC
  Administered 2022-09-01: 0.4 mg via INTRAVENOUS

## 2022-09-01 MED ORDER — TECHNETIUM TC 99M TETROFOSMIN IV KIT
10.2000 | PACK | Freq: Once | INTRAVENOUS | Status: AC | PRN
  Administered 2022-09-01: 10.2 via INTRAVENOUS

## 2022-09-01 MED ORDER — PERFLUTREN LIPID MICROSPHERE
1.0000 mL | INTRAVENOUS | Status: AC | PRN
  Administered 2022-09-01: 2 mL via INTRAVENOUS

## 2022-09-02 LAB — ECHOCARDIOGRAM COMPLETE
Area-P 1/2: 3.74 cm2
Height: 62 in
S' Lateral: 2.6 cm
Weight: 3104 oz

## 2022-09-06 ENCOUNTER — Telehealth: Payer: Self-pay | Admitting: Internal Medicine

## 2022-09-06 NOTE — Telephone Encounter (Signed)
I tried to reach [place] at [facility] in regard to her call earlier. I was on hold for over 5 minutes.   I have reviewed Dr. Brown Human note in regard to pre op assessment. Per Dr. Ali Lowe had ordered an echo and stress test before he cleared the pt.   See ov notes from Dr. Ali Lowe in regard to testing results.   I will fax over the echo and stress test results and ov note to requesting office, as this may answer the questions they may have.

## 2022-09-06 NOTE — Telephone Encounter (Signed)
[facility] called and stated that they needed more documentation that the patient is cleared to have surgery. Please call Caryl Pina at [phone] option 6. [phone] is the fax

## 2022-09-11 ENCOUNTER — Emergency Department (HOSPITAL_COMMUNITY)
Admission: EM | Admit: 2022-09-11 | Discharge: 2022-09-11 | Disposition: A | Discharge status: Home / Self Care | Payer: Medicare Other | Attending: Emergency Medicine | Admitting: Emergency Medicine

## 2022-09-11 ENCOUNTER — Other Ambulatory Visit: Payer: Self-pay

## 2022-09-11 ENCOUNTER — Encounter (HOSPITAL_COMMUNITY): Payer: Self-pay

## 2022-09-11 ENCOUNTER — Emergency Department (HOSPITAL_BASED_OUTPATIENT_CLINIC_OR_DEPARTMENT_OTHER): Payer: Medicare Other

## 2022-09-11 DIAGNOSIS — J45909 Unspecified asthma, uncomplicated: Secondary | ICD-10-CM | POA: Insufficient documentation

## 2022-09-11 DIAGNOSIS — M545 Low back pain, unspecified: Secondary | ICD-10-CM | POA: Diagnosis not present

## 2022-09-11 DIAGNOSIS — E119 Type 2 diabetes mellitus without complications: Secondary | ICD-10-CM | POA: Diagnosis not present

## 2022-09-11 DIAGNOSIS — Z7982 Long term (current) use of aspirin: Secondary | ICD-10-CM | POA: Diagnosis not present

## 2022-09-11 DIAGNOSIS — Z79899 Other long term (current) drug therapy: Secondary | ICD-10-CM | POA: Diagnosis not present

## 2022-09-11 LAB — CBC WITH DIFFERENTIAL/PLATELET
Abs Immature Granulocytes: 0.03 10*3/uL (ref 0.00–0.07)
Basophils Absolute: 0.1 10*3/uL (ref 0.0–0.1)
Basophils Relative: 1 %
Eosinophils Absolute: 0.2 10*3/uL (ref 0.0–0.5)
Eosinophils Relative: 2 %
HCT: 39.7 % (ref 36.0–46.0)
Hemoglobin: 12.9 g/dL (ref 12.0–15.0)
Immature Granulocytes: 0 %
Lymphocytes Relative: 32 %
Lymphs Abs: 2.8 10*3/uL (ref 0.7–4.0)
MCH: 30.3 pg (ref 26.0–34.0)
MCHC: 32.5 g/dL (ref 30.0–36.0)
MCV: 93.2 fL (ref 80.0–100.0)
Monocytes Absolute: 0.5 10*3/uL (ref 0.1–1.0)
Monocytes Relative: 5 %
Neutro Abs: 5.2 10*3/uL (ref 1.7–7.7)
Neutrophils Relative %: 60 %
Platelets: 355 10*3/uL (ref 150–400)
RBC: 4.26 MIL/uL (ref 3.87–5.11)
RDW: 12.4 % (ref 11.5–15.5)
WBC: 8.8 10*3/uL (ref 4.0–10.5)
nRBC: 0 % (ref 0.0–0.2)

## 2022-09-11 LAB — COMPREHENSIVE METABOLIC PANEL
ALT: 15 U/L (ref 0–44)
AST: 18 U/L (ref 15–41)
Albumin: 4.4 g/dL (ref 3.5–5.0)
Alkaline Phosphatase: 36 U/L — ABNORMAL LOW (ref 38–126)
Anion gap: 9 (ref 5–15)
BUN: 23 mg/dL (ref 8–23)
CO2: 22 mmol/L (ref 22–32)
Calcium: 10.4 mg/dL — ABNORMAL HIGH (ref 8.9–10.3)
Chloride: 107 mmol/L (ref 98–111)
Creatinine, Ser: 1.54 mg/dL — ABNORMAL HIGH (ref 0.44–1.00)
GFR, Estimated: 37 mL/min — ABNORMAL LOW (ref >60)
Glucose, Bld: 128 mg/dL — ABNORMAL HIGH (ref 70–99)
Potassium: 4.1 mmol/L (ref 3.5–5.1)
Sodium: 138 mmol/L (ref 135–145)
Total Bilirubin: 0.6 mg/dL (ref 0.3–1.2)
Total Protein: 7.1 g/dL (ref 6.5–8.1)

## 2022-09-11 MED ORDER — ONDANSETRON HCL 4 MG/2ML IJ SOLN
4.0000 mg | Freq: Once | INTRAMUSCULAR | Status: AC
  Administered 2022-09-11: 4 mg via INTRAVENOUS
  Filled 2022-09-11: qty 2

## 2022-09-11 MED ORDER — CYCLOBENZAPRINE HCL 10 MG PO TABS: 10.0000 mg | ORAL_TABLET | Freq: Two times a day (BID) | ORAL | 0 refills | Status: DC | PRN

## 2022-09-11 MED ORDER — CYCLOBENZAPRINE HCL 10 MG PO TABS
5.0000 mg | ORAL_TABLET | Freq: Once | ORAL | Status: AC
  Administered 2022-09-11: 5 mg via ORAL
  Filled 2022-09-11: qty 1

## 2022-09-11 MED ORDER — MORPHINE SULFATE (PF) 4 MG/ML IV SOLN
4.0000 mg | Freq: Once | INTRAVENOUS | Status: AC
  Administered 2022-09-11: 4 mg via INTRAVENOUS
  Filled 2022-09-11: qty 1

## 2022-09-11 NOTE — Progress Notes (Signed)
RLE venous duplex has been completed.  Preliminary results messaged to Joanette Gula, PA-C.    Results can be found under chart review under CV PROC. 09/11/2022 6:48 PM Furqan Gosselin RVT, RDMS

## 2022-09-11 NOTE — ED Triage Notes (Signed)
Patient c/o right lower back pain that radiates down the right hip and upper thigh x 2 weeks. Patient denies any injury or heavy lifting. Patient does endorses a history back surgery.

## 2022-09-11 NOTE — ED Provider Notes (Signed)
[place] DEPT Provider Note   CSN: [id number] Arrival date & time: 09/11/22  1614     History  Chief Complaint  Patient presents with   Back Pain   HPI Chelsea Kerr is a 66 y.o. female with ongoing concern for renal carcinoma per patient being followed by [facility], asthma type 2 diabetes hyperlipidemia hypothyroidism and chronic anxiety depression presenting for back pain. Pain started about a week ago while trying to get out of bed.  Pain is located in the middle of her lower back and extends down her right leg. She also endorses some associated right leg numbness and weakness. Also endorsed painful urination denies hematuria.  Denies fever. Endorses headache that started minutes before our encounter.   Back Pain      Home Medications Prior to Admission medications   Medication Sig Start Date End Date Taking? Authorizing Provider  cyclobenzaprine (FLEXERIL) 10 MG tablet Take 1 tablet (10 mg total) by mouth 2 (two) times daily as needed for muscle spasms. 09/11/22  Yes Harriet Pho, PA-C  albuterol (PROVENTIL HFA;VENTOLIN HFA) 108 (90 BASE) MCG/ACT inhaler Inhale 2 puffs into the lungs every 4 (four) hours as needed for wheezing or shortness of breath. For shortness of breath    [provider]  ALPRAZolam (XANAX) 1 MG tablet Take 1 mg by mouth 3 (three) times daily as needed for anxiety.    [provider]  Ascorbic Acid (VITAMIN C) 500 MG CHEW Chew 2 tablets by mouth daily.    [provider]  aspirin EC 81 MG tablet Take 81 mg by mouth daily. Swallow whole.    [provider]  buPROPion (WELLBUTRIN XL) 300 MG 24 hr tablet Take 300 mg by mouth daily. 04/12/20   [provider]  busPIRone (BUSPAR) 15 MG tablet Take 1 tablet (15 mg total) by mouth 3 (three) times daily. 12/17/20   Geradine Girt, DO  cholecalciferol (VITAMIN D) 1000 units tablet Take 1 tablet (1,000 Units total) by mouth 2 (two)  times daily. 06/22/20   Nita Sells, MD  famotidine (PEPCID) 40 MG tablet Take 1 tablet (40 mg total) by mouth at bedtime. 12/17/20   Geradine Girt, DO  fenofibrate (TRICOR) 145 MG tablet Take 145 mg by mouth daily. 07/26/22   [provider]  fluticasone (FLONASE) 50 MCG/ACT nasal spray Place 2 sprays into both nostrils 2 (two) times daily. 12/17/20   Geradine Girt, DO  Fluticasone-Salmeterol (ADVAIR) 500-50 MCG/DOSE AEPB Inhale 1 puff into the lungs 2 (two) times daily.    [provider]  gabapentin (NEURONTIN) 600 MG tablet Take 0.5 tablets (300 mg total) by mouth 3 (three) times daily. 12/08/11   Phylliss Bob, MD  gemfibrozil (LOPID) 600 MG tablet Take 1 tablet (600 mg total) by mouth 2 (two) times daily before a meal. 06/22/20   Nita Sells, MD  glimepiride (AMARYL) 2 MG tablet Take 1 tablet (2 mg total) by mouth daily with breakfast. 06/23/20   Nita Sells, MD  levothyroxine (SYNTHROID) 175 MCG tablet Take 175 mcg by mouth daily. 06/30/22   [provider]  lisinopril-hydrochlorothiazide (ZESTORETIC) 20-25 MG tablet Take 1 tablet by mouth daily. 06/12/22   [provider]  meclizine (ANTIVERT) 25 MG tablet Take 1 tablet (25 mg total) by mouth 3 (three) times daily as needed for dizziness. 12/17/20   Geradine Girt, DO  meloxicam (MOBIC) 15 MG tablet Take 15 mg by mouth daily. 11/13/20  [provider]  omega-3 acid ethyl esters (LOVAZA) 1 g capsule Take 4 capsules by mouth daily. 07/06/22   [provider]  Loma [place] (OYSTER CALCIUM) 500 MG TABS tablet Take 500 mg of elemental calcium by mouth 2 (two) times daily.    [provider]  pantoprazole (PROTONIX) 40 MG tablet Take 2 tablets (80 mg total) by mouth 2 (two) times daily. 12/17/20   Geradine Girt, DO  rosuvastatin (CRESTOR) 10 MG tablet Take 10 mg by mouth at bedtime. 05/03/20   [provider]  sucralfate (CARAFATE) 1 g tablet Take 1 g by  mouth 2 (two) times daily. 12/06/20   [provider]  zafirlukast (ACCOLATE) 20 MG tablet Take 20 mg by mouth 2 (two) times daily. 08/24/20   [provider]  Zinc 220 (50 Zn) MG CAPS Take 220 mg by mouth daily.    [provider]  zolpidem (AMBIEN) 10 MG tablet Take 10 mg by mouth at bedtime as needed. For sleep    [provider]      Allergies    Patient has no known allergies.    Review of Systems   Review of Systems  Musculoskeletal:  Positive for back pain.    Physical Exam Updated Vital Signs BP (!) 153/92 (BP Location: Left Arm)   Pulse 86   Temp 98.7 F (37.1 C) (Oral)   Resp 18   Ht '5\' 2"'$  (1.575 m)   Wt 85.3 kg   SpO2 99%   BMI 34.39 kg/m  Physical Exam Vitals and nursing note reviewed.  HENT:     Head: Normocephalic and atraumatic.     Mouth/Throat:     Mouth: Mucous membranes are moist.  Eyes:     General:        Right eye: No discharge.        Left eye: No discharge.     Conjunctiva/sclera: Conjunctivae normal.  Cardiovascular:     Rate and Rhythm: Normal rate and regular rhythm.     Pulses: Normal pulses.     Heart sounds: Normal heart sounds.  Pulmonary:     Effort: Pulmonary effort is normal.     Breath sounds: Normal breath sounds.  Abdominal:     General: Abdomen is flat.     Palpations: Abdomen is soft.  Musculoskeletal:     Cervical back: Tenderness present.     Thoracic back: Tenderness present.     Lumbar back: Tenderness present.     Right lower leg: Tenderness present. No swelling or deformity. No edema.     Left lower leg: Normal.     Comments:   Hyperreflexive in the right knee in comparison to the left.  Skin:    General: Skin is warm and dry.  Neurological:     General: No focal deficit present.  Psychiatric:        Mood and Affect: Mood normal.     ED Results / Procedures / Treatments   Labs (all labs ordered are listed, but only abnormal results are displayed) Labs Reviewed   COMPREHENSIVE METABOLIC PANEL - Abnormal; Notable for the following components:      Result Value   Glucose, Bld 128 (*)    Creatinine, Ser 1.54 (*)    Calcium 10.4 (*)    Alkaline Phosphatase 36 (*)    GFR, Estimated 37 (*)    All other components within normal limits  CBC WITH DIFFERENTIAL/PLATELET    EKG None  Radiology  VAS [place] LOWER EXTREMITY VENOUS (DVT) (7a-7p)  Result Date: 09/11/2022  Lower Venous DVT Study Patient Name:  Chelsea Kerr  Date of Exam:   09/11/2022 Medical Rec #: [id number]      Accession #:    [id number] Date of Birth: Jun 16, 1956      Patient Gender: F Patient Age:   62 years Exam Location:  [facility] Procedure:      VAS [place] LOWER EXTREMITY VENOUS (DVT) Referring Phys: Joanette Gula --------------------------------------------------------------------------------  Indications: Right lower back pain radiating down right hip and upper thigh. Hx of back surgery.  Limitations: Poor ultrasound/tissue interface and patient discomfort. Comparison Study: No previous exams Performing Technologist: Jody Hill RVT, RDMS  Examination Guidelines: A complete evaluation includes B-mode imaging, spectral Doppler, color Doppler, and power Doppler as needed of all accessible portions of each vessel. Bilateral testing is considered an integral part of a complete examination. Limited examinations for reoccurring indications may be performed as noted. The reflux portion of the exam is performed with the patient in reverse Trendelenburg.  +---------+---------------+---------+-----------+----------+-------------------+ RIGHT    CompressibilityPhasicitySpontaneityPropertiesThrombus Aging      +---------+---------------+---------+-----------+----------+-------------------+ CFV      Full           Yes      Yes                                      +---------+---------------+---------+-----------+----------+-------------------+ SFJ      Full                                                              +---------+---------------+---------+-----------+----------+-------------------+ FV Prox  Full           Yes      Yes                                      +---------+---------------+---------+-----------+----------+-------------------+ FV Mid   Full           Yes      Yes                                      +---------+---------------+---------+-----------+----------+-------------------+ FV DistalFull           Yes      Yes                                      +---------+---------------+---------+-----------+----------+-------------------+ PFV      Full                                                             +---------+---------------+---------+-----------+----------+-------------------+ POP      Full           Yes      Yes                                      +---------+---------------+---------+-----------+----------+-------------------+  PTV      Full                                                             +---------+---------------+---------+-----------+----------+-------------------+ PERO                                                  Not well visualized +---------+---------------+---------+-----------+----------+-------------------+   +----+---------------+---------+-----------+----------+--------------+ LEFTCompressibilityPhasicitySpontaneityPropertiesThrombus Aging +----+---------------+---------+-----------+----------+--------------+ CFV Full           Yes      Yes                                 +----+---------------+---------+-----------+----------+--------------+    Summary: RIGHT: - There is no evidence of deep vein thrombosis in the lower extremity.  - No cystic structure found in the popliteal fossa.  LEFT: - No evidence of common femoral vein obstruction.  *See table(s) above for measurements and observations.    Preliminary     Procedures Procedures    Medications Ordered in ED Medications   cyclobenzaprine (FLEXERIL) tablet 5 mg (has no administration in time range)  morphine (PF) 4 MG/ML injection 4 mg (4 mg Intravenous Given 09/11/22 1819)  ondansetron (ZOFRAN) injection 4 mg (4 mg Intravenous Given 09/11/22 1818)    ED Course/ Medical Decision Making/ A&P                           Medical Decision Making  This patient presents to the ED for concern of lower back pain, this involves a number of treatment options, and is a complaint that carries with it a moderate risk of complications and morbidity.  The differential diagnosis includes    Co morbidities: Discussed in HPI   Brief History:      EMR reviewed including pt PMHx, past surgical history and past visits to ER.   See HPI for more details   Lab Tests:   I ordered and independently interpreted labs. Labs notable for elevated creatinine   Imaging Studies:  No imaging studies ordered for this patient    Cardiac Monitoring:  The patient was maintained on a cardiac monitor.  I personally viewed and interpreted the cardiac monitored which showed an underlying rhythm of: NSR NA   Medicines ordered:  I ordered medication including Zofran for nausea and morphine for pain. Reevaluation of the patient after these medicines showed that the patient improved I have reviewed the patients home medicines and have made adjustments as needed     Consults/Attending Physician   I discussed this case with my attending physician who cosigned this note including patient's presenting symptoms, physical exam, and planned diagnostics and interventions. Attending physician stated agreement with plan or made changes to plan which were implemented.   Reevaluation:  After the interventions noted above I re-evaluated patient and found that they have :improved    Problem List / ED Course: Patient presented for lower back pain.  Per chart review does have active renal carcinoma.  She is normally followed by  [facility].  I spoke with one of  their urologist during her encounter who was able to pull up her recent MRI patient mentioned that her renal carcinoma is stable is unlikely to have metastasized given that she has only been recently diagnosed.  Assessment of her back, revealed tenderness all over her back but normal range of motion.  Assessment gait revealed limp favoring her right leg.  Patient expressed weakness and numbness up and down her leg.  That given that she does have active renal carcinoma to consider the possibility of DVT.  Ultrasound was negative for DVT.  Treated pain with morphine and muscle relaxer for symptomatic relief.  Upon reevaluation she states she felt much better.  Advised that she follow-up with her PCP for ongoing lower back pain.   Dispostion:  After consideration of the diagnostic results and the patients response to treatment, I feel that the patent would benefit from discharge, conservative treatment for her lower back pain and follow-up with her PCP.         Final Clinical Impression(s) / ED Diagnoses Final diagnoses:  Low back pain, unspecified back pain laterality, unspecified chronicity, unspecified whether sciatica present    Rx / DC Orders ED Discharge Orders          Ordered    cyclobenzaprine (FLEXERIL) 10 MG tablet  2 times daily PRN        09/11/22 2042              Harriet Pho, PA-C 09/11/22 2046    Davonna Belling, MD 09/11/22 2340

## 2022-09-11 NOTE — Discharge Instructions (Signed)
Evaluation for your Lower back pain and right leg weakness and numbness was overall reassuring.  Lower back pain is likely musculoskeletal injury of some kind.  Recommend conservative treatment at this time which includes ibuprofen Tylenol as needed for pain, muscle relaxer intermittently as needed, possible physical therapy evaluation and follow-up with PCP.  If you have new fever, saddle numbness, urinary or bowel incontinence please return to the emergency department for further evaluation.

## 2022-09-12 DIAGNOSIS — Z01810 Encounter for preprocedural cardiovascular examination: Secondary | ICD-10-CM | POA: Diagnosis not present

## 2022-09-12 DIAGNOSIS — R002 Palpitations: Secondary | ICD-10-CM

## 2022-09-13 ENCOUNTER — Ambulatory Visit: Payer: Medicare Other | Admitting: Cardiology

## 2022-09-13 HISTORY — PX: CYSTOSCOPY: SUR368

## 2022-09-20 ENCOUNTER — Other Ambulatory Visit: Payer: Self-pay | Admitting: Urology

## 2022-09-29 NOTE — Patient Instructions (Signed)
SURGICAL WAITING ROOM VISITATION Patients having surgery or a procedure may have no more than 2 support people in the waiting area - these visitors may rotate.   Children under the age of 50 must have an adult with them who is not the patient. If the patient needs to stay at the hospital during part of their recovery, the visitor guidelines for inpatient rooms apply. Pre-op nurse will coordinate an appropriate time for 1 support person to accompany patient in pre-op.  This support person may not rotate.    Please refer to the [facility] website for the visitor guidelines for Inpatients (after your surgery is over and you are in a regular room).    Your procedure is scheduled on: 10/06/22   Report to [facility] Main Entrance    Report to admitting at 10:00 AM   Call this number if you have problems the morning of surgery [phone]   Follow a clear liquid diet the day before surgery.   you may have the following liquids until 9:00 AM DAY OF SURGERY  Water Non-Citrus Juices (without pulp, NO RED) Carbonated Beverages Black Coffee (NO MILK/CREAM OR CREAMERS, sugar ok)  Clear Tea (NO MILK/CREAM OR CREAMERS, sugar ok) regular and decaf                             Plain Jell-O (NO RED)                                           Fruit ices (not with fruit pulp, NO RED)                                     Popsicles (NO RED)                                                               Sports drinks like Gatorade (NO RED)          If you have questions, please contact your surgeon's office.   FOLLOW BOWEL PREP AND ANY ADDITIONAL PRE OP INSTRUCTIONS YOU RECEIVED FROM YOUR SURGEON'S OFFICE!!!     Oral Hygiene is also important to reduce your risk of infection.                                    Remember - BRUSH YOUR TEETH THE MORNING OF SURGERY WITH YOUR REGULAR TOOTHPASTE   Take these medicines the morning of surgery with A SIP OF WATER: Inhalers, Nebulizer,  Bupropion,  Buspirone, Fenofibrate, Gabapentin, Levothyroxine, Pantoprazole   DO NOT TAKE ANY ORAL DIABETIC MEDICATIONS DAY OF YOUR SURGERY  How to Manage Your Diabetes Before and After Surgery  Why is it important to control my blood sugar before and after surgery? Improving blood sugar levels before and after surgery helps healing and can limit problems. A way of improving blood sugar control is eating a healthy diet by:  Eating less sugar and carbohydrates  Increasing activity/exercise  Talking with  your doctor about reaching your blood sugar goals High blood sugars (greater than 180 mg/dL) can raise your risk of infections and slow your recovery, so you will need to focus on controlling your diabetes during the weeks before surgery. Make sure that the doctor who takes care of your diabetes knows about your planned surgery including the date and location.  How do I manage my blood sugar before surgery? Check your blood sugar at least 4 times a day, starting 2 days before surgery, to make sure that the level is not too high or low. Check your blood sugar the morning of your surgery when you wake up and every 2 hours until you get to the Short Stay unit. If your blood sugar is less than 70 mg/dL, you will need to treat for low blood sugar: Do not take insulin. Treat a low blood sugar (less than 70 mg/dL) with  cup of clear juice (cranberry or apple), 4 glucose tablets, OR glucose gel. Recheck blood sugar in 15 minutes after treatment (to make sure it is greater than 70 mg/dL). If your blood sugar is not greater than 70 mg/dL on recheck, call [phone] for further instructions. Report your blood sugar to the short stay nurse when you get to Short Stay.  If you are admitted to the hospital after surgery: Your blood sugar will be checked by the staff and you will probably be given insulin after surgery (instead of oral diabetes medicines) to make sure you have good blood sugar levels. The goal for  blood sugar control after surgery is 80-180 mg/dL.  Reviewed and Endorsed by [facility] Patient Education Committee, August 2015                               You may not have any metal on your body including hair pins, jewelry, and body piercing             Do not wear make-up, lotions, powders, perfumes, or deodorant  Do not wear nail polish including gel and S&S, artificial/acrylic nails, or any other type of covering on natural nails including finger and toenails. If you have artificial nails, gel coating, etc. that needs to be removed by a nail salon please have this removed prior to surgery or surgery may need to be canceled/ delayed if the surgeon/ anesthesia feels like they are unable to be safely monitored.   Do not shave  48 hours prior to surgery.    Do not bring valuables to the hospital. [place].   Contacts, dentures or bridgework may not be worn into surgery.   Bring small overnight bag day of surgery.   DO NOT [place]. PHARMACY WILL DISPENSE MEDICATIONS LISTED ON YOUR MEDICATION LIST TO YOU DURING YOUR ADMISSION [place]!              Please read over the following fact sheets you were given: IF [place] [phone] [name]   If you received a COVID test during your pre-op visit  it is requested that you wear a mask when out in public, stay away from anyone that may not be feeling well and notify your surgeon if you develop symptoms. If you test positive for Covid or have been in  contact with anyone that has tested positive in the last 10 days please notify you surgeon.     [facility] - Preparing for Surgery Before surgery, you can play an important role.  Because skin is not sterile, your skin needs to be as free of germs as possible.  You can reduce the number of germs on your skin by washing with CHG (chlorahexidine  gluconate) soap before surgery.  CHG is an antiseptic cleaner which kills germs and bonds with the skin to continue killing germs even after washing. Please DO NOT use if you have an allergy to CHG or antibacterial soaps.  If your skin becomes reddened/irritated stop using the CHG and inform your nurse when you arrive at Short Stay. Do not shave (including legs and underarms) for at least 48 hours prior to the first CHG shower.  You may shave your face/neck.  Please follow these instructions carefully:  1.  Shower with CHG Soap the night before surgery and the  morning of surgery.  2.  If you choose to wash your hair, wash your hair first as usual with your normal  shampoo.  3.  After you shampoo, rinse your hair and body thoroughly to remove the shampoo.                             4.  Use CHG as you would any other liquid soap.  You can apply chg directly to the skin and wash.  Gently with a scrungie or clean washcloth.  5.  Apply the CHG Soap to your body ONLY FROM THE NECK DOWN.   Do   not use on face/ open                           Wound or open sores. Avoid contact with eyes, ears mouth and   genitals (private parts).                       Wash face,  Genitals (private parts) with your normal soap.             6.  Wash thoroughly, paying special attention to the area where your    surgery  will be performed.  7.  Thoroughly rinse your body with warm water from the neck down.  8.  DO NOT shower/wash with your normal soap after using and rinsing off the CHG Soap.                9.  Pat yourself dry with a clean towel.            10.  Wear clean pajamas.            11.  Place clean sheets on your bed the night of your first shower and do not  sleep with pets. Day of Surgery : Do not apply any lotions/deodorants the morning of surgery.  Please wear clean clothes to the hospital/surgery center.  FAILURE TO FOLLOW THESE INSTRUCTIONS MAY RESULT IN THE CANCELLATION OF YOUR SURGERY  PATIENT  SIGNATURE_________________________________  NURSE SIGNATURE__________________________________  ________________________________________________________________________  WHAT IS A BLOOD TRANSFUSION? Blood Transfusion Information  A transfusion is the replacement of blood or some of its parts. Blood is made up of multiple cells which provide different functions. Red blood cells carry oxygen and are used for blood loss replacement. White blood cells fight against infection.  Platelets control bleeding. Plasma helps clot blood. Other blood products are available for specialized needs, such as hemophilia or other clotting disorders. BEFORE THE TRANSFUSION  Who gives blood for transfusions?  Healthy volunteers who are fully evaluated to make sure their blood is safe. This is blood bank blood. Transfusion therapy is the safest it has ever been in the practice of medicine. Before blood is taken from a donor, a complete history is taken to make sure that person has no history of diseases nor engages in risky social behavior (examples are intravenous drug use or sexual activity with multiple partners). The donor's travel history is screened to minimize risk of transmitting infections, such as malaria. The donated blood is tested for signs of infectious diseases, such as HIV and hepatitis. The blood is then tested to be sure it is compatible with you in order to minimize the chance of a transfusion reaction. If you or a relative donates blood, this is often done in anticipation of surgery and is not appropriate for emergency situations. It takes many days to process the donated blood. RISKS AND COMPLICATIONS Although transfusion therapy is very safe and saves many lives, the main dangers of transfusion include:  Getting an infectious disease. Developing a transfusion reaction. This is an allergic reaction to something in the blood you were given. Every precaution is taken to prevent this. The decision to  have a blood transfusion has been considered carefully by your caregiver before blood is given. Blood is not given unless the benefits outweigh the risks. AFTER THE TRANSFUSION Right after receiving a blood transfusion, you will usually feel much better and more energetic. This is especially true if your red blood cells have gotten low (anemic). The transfusion raises the level of the red blood cells which carry oxygen, and this usually causes an energy increase. The nurse administering the transfusion will monitor you carefully for complications. HOME CARE INSTRUCTIONS  No special instructions are needed after a transfusion. You may find your energy is better. Speak with your caregiver about any limitations on activity for underlying diseases you may have. SEEK MEDICAL CARE IF:  Your condition is not improving after your transfusion. You develop redness or irritation at the intravenous (IV) site. SEEK IMMEDIATE MEDICAL CARE IF:  Any of the following symptoms occur over the next 12 hours: Shaking chills. You have a temperature by mouth above 102 F (38.9 C), not controlled by medicine. Chest, back, or muscle pain. People around you feel you are not acting correctly or are confused. Shortness of breath or difficulty breathing. Dizziness and fainting. You get a rash or develop hives. You have a decrease in urine output. Your urine turns a dark color or changes to pink, red, or brown. Any of the following symptoms occur over the next 10 days: You have a temperature by mouth above 102 F (38.9 C), not controlled by medicine. Shortness of breath. Weakness after normal activity. The white part of the eye turns yellow (jaundice). You have a decrease in the amount of urine or are urinating less often. Your urine turns a dark color or changes to pink, red, or brown. Document Released: 11/17/2000 Document Revised: 02/12/2012 Document Reviewed: 07/06/2008 [facility] Patient Information 2014  [place], [place].  _______________________________________________________________________

## 2022-09-29 NOTE — Progress Notes (Signed)
COVID Vaccine Completed:  Date of COVID positive in last 90 days:  PCP - Caralyn Guile, DO Cardiologist - Lenna Sciara, MD  Chest x-ray - 07/10/22 Epic EKG - 08/30/22 Epic Stress Test - 09/01/22 Epic ECHO - 09/02/22 Epic Cardiac Cath -  Pacemaker/ICD device last checked: Spinal Cord Stimulator:  Bowel Prep -   Sleep Study -  CPAP -   Fasting Blood Sugar -  Checks Blood Sugar _____ times a day  Blood Thinner Instructions: Aspirin Instructions: ASA 81 Last Dose:  Activity level:  Can go up a flight of stairs and perform activities of daily living without stopping and without symptoms of chest pain or shortness of breath.  Able to exercise without symptoms  Unable to go up a flight of stairs without symptoms of     Anesthesia review: cardiac clearance??, DM2, COPD, aortic atherosclerosis   Patient denies shortness of breath, fever, cough and chest pain at PAT appointment  Patient verbalized understanding of instructions that were given to them at the PAT appointment. Patient was also instructed that they will need to review over the PAT instructions again at home before surgery.

## 2022-10-03 ENCOUNTER — Encounter (HOSPITAL_COMMUNITY)
Admission: RE | Admit: 2022-10-03 | Discharge: 2022-10-03 | Disposition: A | Discharge status: Home / Self Care | Payer: Medicare Other | Source: Ambulatory Visit | Attending: Urology | Admitting: Urology

## 2022-10-03 ENCOUNTER — Encounter (HOSPITAL_COMMUNITY): Payer: Self-pay

## 2022-10-03 VITALS — BP 169/83 | HR 77 | Temp 98.2°F | Resp 16 | Ht 62.0 in | Wt 188.0 lb

## 2022-10-03 DIAGNOSIS — Z01818 Encounter for other preprocedural examination: Secondary | ICD-10-CM | POA: Diagnosis present

## 2022-10-03 DIAGNOSIS — E1169 Type 2 diabetes mellitus with other specified complication: Secondary | ICD-10-CM | POA: Diagnosis not present

## 2022-10-03 DIAGNOSIS — E669 Obesity, unspecified: Secondary | ICD-10-CM | POA: Insufficient documentation

## 2022-10-03 HISTORY — DX: Gangrene, not elsewhere classified: I96

## 2022-10-03 HISTORY — DX: Pneumonia, unspecified organism: J18.9

## 2022-10-03 HISTORY — DX: Fibromyalgia: M79.7

## 2022-10-03 HISTORY — DX: Headache, unspecified: R51.9

## 2022-10-03 HISTORY — DX: Essential (primary) hypertension: I10

## 2022-10-03 LAB — GLUCOSE, CAPILLARY: Glucose-Capillary: 151 mg/dL — ABNORMAL HIGH (ref 70–99)

## 2022-10-03 LAB — HEMOGLOBIN A1C
Hgb A1c MFr Bld: 7 % — ABNORMAL HIGH (ref 4.8–5.6)
Mean Plasma Glucose: 154.2 mg/dL

## 2022-10-04 ENCOUNTER — Encounter (HOSPITAL_COMMUNITY): Payer: Self-pay | Admitting: Physician Assistant

## 2022-10-04 ENCOUNTER — Encounter (HOSPITAL_COMMUNITY): Payer: Self-pay | Admitting: Certified Registered"

## 2022-10-04 NOTE — Anesthesia Preprocedure Evaluation (Deleted)
Anesthesia Evaluation    Airway        Dental   Pulmonary           Cardiovascular hypertension,      Neuro/Psych    GI/Hepatic   Endo/Other  diabetes  Renal/GU      Musculoskeletal   Abdominal   Peds  Hematology   Anesthesia Other Findings   Reproductive/Obstetrics                             Anesthesia Physical Anesthesia Plan  ASA:   Anesthesia Plan:    Post-op Pain Management:    Induction:   PONV Risk Score and Plan:   Airway Management Planned:   Additional Equipment:   Intra-op Plan:   Post-operative Plan:   Informed Consent:   Plan Discussed with:   Anesthesia Plan Comments: (See PAT note 10/03/2022)        Anesthesia Quick Evaluation
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
## 2022-10-06 ENCOUNTER — Encounter (HOSPITAL_COMMUNITY): Admission: RE | Payer: Self-pay | Source: Ambulatory Visit

## 2022-10-06 ENCOUNTER — Inpatient Hospital Stay (HOSPITAL_COMMUNITY): Admission: RE | Admit: 2022-10-06 | Payer: Medicare Other | Source: Ambulatory Visit | Admitting: Urology

## 2022-10-06 LAB — TYPE AND SCREEN
ABO/RH(D): B POS
Antibody Screen: NEGATIVE

## 2022-10-06 SURGERY — NEPHRECTOMY, RADICAL, ROBOT-ASSISTED, LAPAROSCOPIC, ADULT
Anesthesia: General | Laterality: Right
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
## 2022-10-06 NOTE — Progress Notes (Signed)
Patient called this morning to cancel her surgery for today   Patient stated that she is having sinus issues, she was unable to sleep last night, and she had a pounding headache, feels like you heart is racing, head is swimmy,   Patient stated she had a bad day yesterday, patient expressed she was upset about having surgery today after being with family yesterday.  Patient stated she is not nervous about the surgery.    Patient said she felt that she didn't need to have surgery this morning.   Dr Tresa Moore wants to talk with patient

## 2022-10-06 NOTE — Discharge Instructions (Signed)

## 2022-11-10 NOTE — Progress Notes (Signed)
Anesthesia Review:  PCP: Cardiologist : Chest x-ray : 07/10/22- 2 view  EKG : 08/30/22  Monitor- 09/28/22  Echo : 09/02/22  Stress test: 09/01/22  Vascular- 09/12/22  Cardiac Cath :  Activity level:  Sleep Study/ CPAP : Fasting Blood Sugar :      / Checks Blood Sugar -- times a day:   Blood Thinner/ Instructions /Last Dose: ASA / Instructions/ Last Dose :  10/03/22- hgba1c- 7.0   81 mg aspirin    10/06/22- surgery cancelled

## 2022-11-10 NOTE — Patient Instructions (Signed)
SURGICAL WAITING ROOM VISITATION Patients having surgery or a procedure may have no more than 2 support people in the waiting area - these visitors may rotate.   Children under the age of 59 must have an adult with them who is not the patient. If the patient needs to stay at the hospital during part of their recovery, the visitor guidelines for inpatient rooms apply. Pre-op nurse will coordinate an appropriate time for 1 support person to accompany patient in pre-op.  This support person may not rotate.    Please refer to the [facility] website for the visitor guidelines for Inpatients (after your surgery is over and you are in a regular room).       Your procedure is scheduled on:  11/22/22    Report to [facility] Main Entrance    Report to admitting at   1100AM   Call this number if you have problems the morning of surgery [phone]  Clear  liquid diet the day before surgery            Dr ink one bottle of magnesium citrate by 12 noon day before surgery.     After Midnight you may have the folloing liquids until _ 1000 _____ AM/ PM DAY OF SURGERY  Water Non-Citrus Juices (without pulp, NO RED) Carbonated Beverages Black Coffee (NO MILK/CREAM OR CREAMERS, sugar ok)  Clear Tea (NO MILK/CREAM OR CREAMERS, sugar ok) regular and decaf                             Plain Jell-O (NO RED)                                           Fruit ices (not with fruit pulp, NO RED)                                     Popsicles (NO RED)                                                               Sports drinks like Gatorade (NO RED)                        If you have questions, please contact your surgeon's office.   FOLLOW BOWEL PREP AND ANY ADDITIONAL PRE OP INSTRUCTIONS YOU RECEIVED FROM YOUR SURGEON'S OFFICE!!!     Oral Hygiene is also important to reduce your risk of infection.                                    Remember - BRUSH YOUR TEETH THE MORNING OF SURGERY WITH YOUR  REGULAR TOOTHPASTE  DENTURES WILL BE REMOVED PRIOR TO SURGERY PLEASE DO NOT APPLY "Poly grip" OR ADHESIVES!!!   Do NOT smoke after Midnight   Take these medicines the morning of surgery with A SIP OF WATER:  inhalers as usual and bring, nebulizer if needed, wellbutrin, buspar, adviar, gabapentin,  synthroid   DO NOT TAKE ANY ORAL DIABETIC MEDICATIONS DAY OF YOUR SURGERY  Bring CPAP mask and tubing day of surgery.                              You may not have any metal on your body including hair pins, jewelry, and body piercing             Do not wear make-up, lotions, powders, perfumes/cologne, or deodorant  Do not wear nail polish including gel and S&S, artificial/acrylic nails, or any other type of covering on natural nails including finger and toenails. If you have artificial nails, gel coating, etc. that needs to be removed by a nail salon please have this removed prior to surgery or surgery may need to be canceled/ delayed if the surgeon/ anesthesia feels like they are unable to be safely monitored.   Do not shave  48 hours prior to surgery.               Men may shave face and neck.   Do not bring valuables to the hospital. [place].   Contacts, glasses, dentures or bridgework may not be worn into surgery.   Bring small overnight bag day of surgery.   DO NOT [place]. PHARMACY WILL DISPENSE MEDICATIONS LISTED ON YOUR MEDICATION LIST TO YOU DURING YOUR ADMISSION [employer]!    Patients discharged on the day of surgery will not be allowed to drive home.  Someone NEEDS to stay with you for the first 24 hours after anesthesia.   Special Instructions: Bring a copy of your healthcare power of attorney and living will documents the day of surgery if you haven't scanned them before.              Please read over the following fact sheets you were given: IF [place] [phone]   If you received a COVID test during your pre-op visit  it is requested that you wear a mask when out in public, stay away from anyone that may not be feeling well and notify your surgeon if you develop symptoms. If you test positive for Covid or have been in contact with anyone that has tested positive in the last 10 days please notify you surgeon.    [facility] - Preparing for Surgery Before surgery, you can play an important role.  Because skin is not sterile, your skin needs to be as free of germs as possible.  You can reduce the number of germs on your skin by washing with CHG (chlorahexidine gluconate) soap before surgery.  CHG is an antiseptic cleaner which kills germs and bonds with the skin to continue killing germs even after washing. Please DO NOT use if you have an allergy to CHG or antibacterial soaps.  If your skin becomes reddened/irritated stop using the CHG and inform your nurse when you arrive at Short Stay. Do not shave (including legs and underarms) for at least 48 hours prior to the first CHG shower.  You may shave your face/neck. Please follow these instructions carefully:  1.  Shower with CHG Soap the night before surgery and the  morning of Surgery.  2.  If you choose to wash your hair, wash your hair  first as usual with your  normal  shampoo.  3.  After you shampoo, rinse your hair and body thoroughly to remove the  shampoo.                           4.  Use CHG as you would any other liquid soap.  You can apply chg directly  to the skin and wash                       Gently with a scrungie or clean washcloth.  5.  Apply the CHG Soap to your body ONLY FROM THE NECK DOWN.   Do not use on face/ open                           Wound or open sores. Avoid contact with eyes, ears mouth and genitals (private parts).                       Wash face,  Genitals (private parts) with your normal soap.             6.  Wash thoroughly, paying special  attention to the area where your surgery  will be performed.  7.  Thoroughly rinse your body with warm water from the neck down.  8.  DO NOT shower/wash with your normal soap after using and rinsing off  the CHG Soap.                9.  Pat yourself dry with a clean towel.            10.  Wear clean pajamas.            11.  Place clean sheets on your bed the night of your first shower and do not  sleep with pets. Day of Surgery : Do not apply any lotions/deodorants the morning of surgery.  Please wear clean clothes to the hospital/surgery center.  FAILURE TO FOLLOW THESE INSTRUCTIONS MAY RESULT IN THE CANCELLATION OF YOUR SURGERY PATIENT SIGNATURE_________________________________  NURSE SIGNATURE__________________________________  ________________________________________________________________________

## 2022-11-13 ENCOUNTER — Encounter (HOSPITAL_COMMUNITY): Payer: Self-pay

## 2022-11-13 ENCOUNTER — Encounter (HOSPITAL_COMMUNITY)
Admission: RE | Admit: 2022-11-13 | Discharge: 2022-11-13 | Disposition: A | Discharge status: Home / Self Care | Payer: Medicare Other | Source: Ambulatory Visit | Attending: Urology | Admitting: Urology

## 2022-11-13 ENCOUNTER — Other Ambulatory Visit: Payer: Self-pay

## 2022-11-13 VITALS — BP 122/59 | HR 81 | Temp 98.3°F | Resp 16 | Ht 62.0 in

## 2022-11-13 DIAGNOSIS — Z01818 Encounter for other preprocedural examination: Secondary | ICD-10-CM | POA: Diagnosis present

## 2022-11-13 HISTORY — DX: Dyspnea, unspecified: R06.00

## 2022-11-13 HISTORY — DX: Prediabetes: R73.03

## 2022-11-13 LAB — CBC
HCT: 34.7 % — ABNORMAL LOW (ref 36.0–46.0)
Hemoglobin: 10.9 g/dL — ABNORMAL LOW (ref 12.0–15.0)
MCH: 29.9 pg (ref 26.0–34.0)
MCHC: 31.4 g/dL (ref 30.0–36.0)
MCV: 95.1 fL (ref 80.0–100.0)
Platelets: 460 10*3/uL — ABNORMAL HIGH (ref 150–400)
RBC: 3.65 MIL/uL — ABNORMAL LOW (ref 3.87–5.11)
RDW: 13.8 % (ref 11.5–15.5)
WBC: 8.6 10*3/uL (ref 4.0–10.5)
nRBC: 0 % (ref 0.0–0.2)

## 2022-11-13 LAB — BASIC METABOLIC PANEL
Anion gap: 8 (ref 5–15)
BUN: 35 mg/dL — ABNORMAL HIGH (ref 8–23)
CO2: 21 mmol/L — ABNORMAL LOW (ref 22–32)
Calcium: 10.1 mg/dL (ref 8.9–10.3)
Chloride: 108 mmol/L (ref 98–111)
Creatinine, Ser: 1.8 mg/dL — ABNORMAL HIGH (ref 0.44–1.00)
GFR, Estimated: 31 mL/min — ABNORMAL LOW (ref >60)
Glucose, Bld: 142 mg/dL — ABNORMAL HIGH (ref 70–99)
Potassium: 3.9 mmol/L (ref 3.5–5.1)
Sodium: 137 mmol/L (ref 135–145)

## 2022-11-13 LAB — GLUCOSE, CAPILLARY: Glucose-Capillary: 135 mg/dL — ABNORMAL HIGH (ref 70–99)

## 2022-11-22 ENCOUNTER — Inpatient Hospital Stay (HOSPITAL_COMMUNITY): Payer: Medicare Other | Admitting: Physician Assistant

## 2022-11-22 ENCOUNTER — Inpatient Hospital Stay (HOSPITAL_COMMUNITY)
Admission: RE | Admit: 2022-11-22 | Discharge: 2022-11-29 | DRG: 657 | Disposition: A | Discharge status: Home Health | Payer: Medicare Other | Attending: Urology | Admitting: Urology

## 2022-11-22 ENCOUNTER — Inpatient Hospital Stay (HOSPITAL_COMMUNITY): Payer: Medicare Other | Admitting: Anesthesiology

## 2022-11-22 ENCOUNTER — Encounter (HOSPITAL_COMMUNITY)
Admission: RE | Disposition: A | Discharge status: Home Health | Payer: Self-pay | Source: Home / Self Care | Attending: Urology

## 2022-11-22 ENCOUNTER — Encounter (HOSPITAL_COMMUNITY): Payer: Self-pay | Admitting: Urology

## 2022-11-22 DIAGNOSIS — E119 Type 2 diabetes mellitus without complications: Secondary | ICD-10-CM | POA: Diagnosis not present

## 2022-11-22 DIAGNOSIS — F32A Depression, unspecified: Secondary | ICD-10-CM | POA: Diagnosis present

## 2022-11-22 DIAGNOSIS — Z7984 Long term (current) use of oral hypoglycemic drugs: Secondary | ICD-10-CM | POA: Diagnosis not present

## 2022-11-22 DIAGNOSIS — D62 Acute posthemorrhagic anemia: Secondary | ICD-10-CM | POA: Diagnosis not present

## 2022-11-22 DIAGNOSIS — Z6830 Body mass index (BMI) 30.0-30.9, adult: Secondary | ICD-10-CM | POA: Diagnosis not present

## 2022-11-22 DIAGNOSIS — D631 Anemia in chronic kidney disease: Secondary | ICD-10-CM | POA: Diagnosis present

## 2022-11-22 DIAGNOSIS — I10 Essential (primary) hypertension: Secondary | ICD-10-CM

## 2022-11-22 DIAGNOSIS — Z8701 Personal history of pneumonia (recurrent): Secondary | ICD-10-CM

## 2022-11-22 DIAGNOSIS — Z90710 Acquired absence of both cervix and uterus: Secondary | ICD-10-CM | POA: Diagnosis not present

## 2022-11-22 DIAGNOSIS — C641 Malignant neoplasm of right kidney, except renal pelvis: Principal | ICD-10-CM | POA: Diagnosis present

## 2022-11-22 DIAGNOSIS — N2889 Other specified disorders of kidney and ureter: Secondary | ICD-10-CM

## 2022-11-22 DIAGNOSIS — E1122 Type 2 diabetes mellitus with diabetic chronic kidney disease: Secondary | ICD-10-CM | POA: Diagnosis present

## 2022-11-22 DIAGNOSIS — K219 Gastro-esophageal reflux disease without esophagitis: Secondary | ICD-10-CM | POA: Diagnosis present

## 2022-11-22 DIAGNOSIS — R0609 Other forms of dyspnea: Secondary | ICD-10-CM | POA: Diagnosis present

## 2022-11-22 DIAGNOSIS — Z01818 Encounter for other preprocedural examination: Secondary | ICD-10-CM

## 2022-11-22 DIAGNOSIS — Z886 Allergy status to analgesic agent status: Secondary | ICD-10-CM | POA: Diagnosis not present

## 2022-11-22 DIAGNOSIS — F419 Anxiety disorder, unspecified: Secondary | ICD-10-CM | POA: Diagnosis present

## 2022-11-22 DIAGNOSIS — Z8249 Family history of ischemic heart disease and other diseases of the circulatory system: Secondary | ICD-10-CM

## 2022-11-22 DIAGNOSIS — E669 Obesity, unspecified: Secondary | ICD-10-CM | POA: Diagnosis present

## 2022-11-22 DIAGNOSIS — J449 Chronic obstructive pulmonary disease, unspecified: Secondary | ICD-10-CM | POA: Diagnosis present

## 2022-11-22 DIAGNOSIS — Z9049 Acquired absence of other specified parts of digestive tract: Secondary | ICD-10-CM

## 2022-11-22 DIAGNOSIS — E039 Hypothyroidism, unspecified: Secondary | ICD-10-CM | POA: Diagnosis present

## 2022-11-22 DIAGNOSIS — K660 Peritoneal adhesions (postprocedural) (postinfection): Secondary | ICD-10-CM | POA: Diagnosis present

## 2022-11-22 DIAGNOSIS — E1152 Type 2 diabetes mellitus with diabetic peripheral angiopathy with gangrene: Secondary | ICD-10-CM | POA: Diagnosis present

## 2022-11-22 DIAGNOSIS — M1990 Unspecified osteoarthritis, unspecified site: Secondary | ICD-10-CM | POA: Diagnosis present

## 2022-11-22 DIAGNOSIS — G8918 Other acute postprocedural pain: Secondary | ICD-10-CM | POA: Diagnosis not present

## 2022-11-22 DIAGNOSIS — Z79899 Other long term (current) drug therapy: Secondary | ICD-10-CM

## 2022-11-22 DIAGNOSIS — M797 Fibromyalgia: Secondary | ICD-10-CM | POA: Diagnosis present

## 2022-11-22 DIAGNOSIS — N183 Chronic kidney disease, stage 3 unspecified: Secondary | ICD-10-CM | POA: Diagnosis present

## 2022-11-22 DIAGNOSIS — I129 Hypertensive chronic kidney disease with stage 1 through stage 4 chronic kidney disease, or unspecified chronic kidney disease: Secondary | ICD-10-CM | POA: Diagnosis present

## 2022-11-22 HISTORY — DX: Type 2 diabetes mellitus without complications: E11.9

## 2022-11-22 HISTORY — DX: Other specified disorders of kidney and ureter: N28.89

## 2022-11-22 LAB — TYPE AND SCREEN
ABO/RH(D): B POS
Antibody Screen: NEGATIVE

## 2022-11-22 LAB — HEMOGLOBIN AND HEMATOCRIT, BLOOD
HCT: 33.4 % — ABNORMAL LOW (ref 36.0–46.0)
Hemoglobin: 10.5 g/dL — ABNORMAL LOW (ref 12.0–15.0)

## 2022-11-22 LAB — GLUCOSE, CAPILLARY
Glucose-Capillary: 127 mg/dL — ABNORMAL HIGH (ref 70–99)
Glucose-Capillary: 162 mg/dL — ABNORMAL HIGH (ref 70–99)

## 2022-11-22 SURGERY — NEPHRECTOMY, PARTIAL, ROBOT-ASSISTED, RETROPERITONEAL APPROACH
Anesthesia: General | Site: Flank | Laterality: Right

## 2022-11-22 MED ORDER — HYDROMORPHONE HCL 1 MG/ML IJ SOLN
INTRAMUSCULAR | Status: AC
  Administered 2022-11-22: 0.5 mg via INTRAVENOUS
  Filled 2022-11-22: qty 1

## 2022-11-22 MED ORDER — DIPHENHYDRAMINE HCL 12.5 MG/5ML PO ELIX: 12.5000 mg | ORAL_SOLUTION | Freq: Four times a day (QID) | ORAL | Status: DC | PRN

## 2022-11-22 MED ORDER — HYOSCYAMINE SULFATE 0.125 MG SL SUBL: 0.1250 mg | SUBLINGUAL_TABLET | SUBLINGUAL | Status: DC | PRN

## 2022-11-22 MED ORDER — FENTANYL CITRATE (PF) 100 MCG/2ML IJ SOLN
INTRAMUSCULAR | Status: DC | PRN
  Administered 2022-11-22 (×2): 50 ug via INTRAVENOUS

## 2022-11-22 MED ORDER — SUGAMMADEX SODIUM 200 MG/2ML IV SOLN
INTRAVENOUS | Status: DC | PRN
  Administered 2022-11-22: 200 mg via INTRAVENOUS

## 2022-11-22 MED ORDER — ACETAMINOPHEN 10 MG/ML IV SOLN
INTRAVENOUS | Status: AC
  Filled 2022-11-22: qty 100

## 2022-11-22 MED ORDER — ACETAMINOPHEN 500 MG PO TABS
1000.0000 mg | ORAL_TABLET | Freq: Once | ORAL | Status: AC
  Administered 2022-11-22: 1000 mg via ORAL
  Filled 2022-11-22: qty 2

## 2022-11-22 MED ORDER — KETAMINE HCL 50 MG/5ML IJ SOSY
PREFILLED_SYRINGE | INTRAMUSCULAR | Status: AC
  Filled 2022-11-22: qty 5

## 2022-11-22 MED ORDER — ZOLPIDEM TARTRATE 10 MG PO TABS
5.0000 mg | ORAL_TABLET | Freq: Every evening | ORAL | Status: DC | PRN
  Administered 2022-11-23 – 2022-11-28 (×6): 5 mg via ORAL
  Filled 2022-11-22 (×6): qty 1

## 2022-11-22 MED ORDER — HYDROMORPHONE HCL 1 MG/ML IJ SOLN
0.5000 mg | INTRAMUSCULAR | Status: AC | PRN
  Administered 2022-11-22 (×2): 0.5 mg via INTRAVENOUS

## 2022-11-22 MED ORDER — SODIUM CHLORIDE (PF) 0.9 % IJ SOLN
INTRAMUSCULAR | Status: AC
  Filled 2022-11-22: qty 20

## 2022-11-22 MED ORDER — DIPHENHYDRAMINE HCL 50 MG/ML IJ SOLN: 12.5000 mg | Freq: Four times a day (QID) | INTRAMUSCULAR | Status: DC | PRN

## 2022-11-22 MED ORDER — LIDOCAINE 20MG/ML (2%) 15 ML SYRINGE OPTIME
INTRAMUSCULAR | Status: DC | PRN
  Administered 2022-11-22: 1.5 mg/kg/h via INTRAVENOUS

## 2022-11-22 MED ORDER — PHENYLEPHRINE 80 MCG/ML (10ML) SYRINGE FOR IV PUSH (FOR BLOOD PRESSURE SUPPORT)
PREFILLED_SYRINGE | INTRAVENOUS | Status: AC
  Filled 2022-11-22: qty 10

## 2022-11-22 MED ORDER — PROPOFOL 10 MG/ML IV BOLUS
INTRAVENOUS | Status: AC
  Filled 2022-11-22: qty 20

## 2022-11-22 MED ORDER — BUSPIRONE HCL 5 MG PO TABS
15.0000 mg | ORAL_TABLET | Freq: Three times a day (TID) | ORAL | Status: DC
  Administered 2022-11-22 – 2022-11-29 (×20): 15 mg via ORAL
  Filled 2022-11-22 (×20): qty 1

## 2022-11-22 MED ORDER — LACTATED RINGERS IR SOLN
Status: DC | PRN
  Administered 2022-11-22: 1000 mL

## 2022-11-22 MED ORDER — GABAPENTIN 300 MG PO CAPS
300.0000 mg | ORAL_CAPSULE | Freq: Three times a day (TID) | ORAL | Status: DC
  Administered 2022-11-22 – 2022-11-23 (×2): 300 mg via ORAL
  Filled 2022-11-22 (×2): qty 1

## 2022-11-22 MED ORDER — ALBUTEROL SULFATE HFA 108 (90 BASE) MCG/ACT IN AERS: 2.0000 | INHALATION_SPRAY | RESPIRATORY_TRACT | Status: DC | PRN

## 2022-11-22 MED ORDER — LIDOCAINE 2% (20 MG/ML) 5 ML SYRINGE
INTRAMUSCULAR | Status: DC | PRN
  Administered 2022-11-22: 60 mg via INTRAVENOUS

## 2022-11-22 MED ORDER — FENTANYL CITRATE PF 50 MCG/ML IJ SOSY
25.0000 ug | PREFILLED_SYRINGE | INTRAMUSCULAR | Status: DC | PRN
  Administered 2022-11-22 (×2): 50 ug via INTRAVENOUS

## 2022-11-22 MED ORDER — ROSUVASTATIN CALCIUM 10 MG PO TABS
10.0000 mg | ORAL_TABLET | Freq: Every day | ORAL | Status: DC
  Administered 2022-11-22 – 2022-11-28 (×7): 10 mg via ORAL
  Filled 2022-11-22 (×7): qty 1

## 2022-11-22 MED ORDER — ALBUTEROL SULFATE (2.5 MG/3ML) 0.083% IN NEBU: 2.5000 mg | INHALATION_SOLUTION | Freq: Four times a day (QID) | RESPIRATORY_TRACT | Status: DC | PRN

## 2022-11-22 MED ORDER — MOMETASONE FURO-FORMOTEROL FUM 200-5 MCG/ACT IN AERO
2.0000 | INHALATION_SPRAY | Freq: Two times a day (BID) | RESPIRATORY_TRACT | Status: DC
  Administered 2022-11-23 – 2022-11-29 (×13): 2 via RESPIRATORY_TRACT
  Filled 2022-11-22: qty 8.8

## 2022-11-22 MED ORDER — MIDAZOLAM HCL 5 MG/5ML IJ SOLN
INTRAMUSCULAR | Status: DC | PRN
  Administered 2022-11-22: 2 mg via INTRAVENOUS

## 2022-11-22 MED ORDER — SODIUM CHLORIDE 0.45 % IV SOLN: INTRAVENOUS | Status: DC

## 2022-11-22 MED ORDER — LIDOCAINE HCL (PF) 2 % IJ SOLN
INTRAMUSCULAR | Status: AC
  Filled 2022-11-22: qty 5

## 2022-11-22 MED ORDER — OXYCODONE HCL 5 MG PO TABS
ORAL_TABLET | ORAL | Status: AC
  Administered 2022-11-22: 10 mg via ORAL
  Filled 2022-11-22: qty 1

## 2022-11-22 MED ORDER — FENTANYL CITRATE (PF) 100 MCG/2ML IJ SOLN
INTRAMUSCULAR | Status: AC
  Filled 2022-11-22: qty 2

## 2022-11-22 MED ORDER — OXYCODONE HCL 5 MG PO TABS: 5.0000 mg | ORAL_TABLET | ORAL | Status: DC | PRN

## 2022-11-22 MED ORDER — LEVOTHYROXINE SODIUM 50 MCG PO TABS
175.0000 ug | ORAL_TABLET | Freq: Every day | ORAL | Status: DC
  Administered 2022-11-23 – 2022-11-29 (×7): 175 ug via ORAL
  Filled 2022-11-22 (×7): qty 1

## 2022-11-22 MED ORDER — DOCUSATE SODIUM 100 MG PO CAPS: 100.0000 mg | ORAL_CAPSULE | Freq: Two times a day (BID) | ORAL | Status: DC

## 2022-11-22 MED ORDER — DEXAMETHASONE SODIUM PHOSPHATE 10 MG/ML IJ SOLN
INTRAMUSCULAR | Status: DC | PRN
  Administered 2022-11-22: 10 mg via INTRAVENOUS

## 2022-11-22 MED ORDER — BUPROPION HCL ER (XL) 300 MG PO TB24
300.0000 mg | ORAL_TABLET | Freq: Every day | ORAL | Status: DC
  Administered 2022-11-23 – 2022-11-29 (×7): 300 mg via ORAL
  Filled 2022-11-22 (×7): qty 1

## 2022-11-22 MED ORDER — ONDANSETRON HCL 4 MG/2ML IJ SOLN
INTRAMUSCULAR | Status: AC
  Filled 2022-11-22: qty 2

## 2022-11-22 MED ORDER — FAMOTIDINE 20 MG PO TABS: 40.0000 mg | ORAL_TABLET | Freq: Every day | ORAL | Status: DC

## 2022-11-22 MED ORDER — FENTANYL CITRATE PF 50 MCG/ML IJ SOSY
PREFILLED_SYRINGE | INTRAMUSCULAR | Status: AC
  Administered 2022-11-22: 50 ug via INTRAVENOUS
  Filled 2022-11-22: qty 3

## 2022-11-22 MED ORDER — ROCURONIUM BROMIDE 10 MG/ML (PF) SYRINGE
PREFILLED_SYRINGE | INTRAVENOUS | Status: DC | PRN
  Administered 2022-11-22: 100 mg via INTRAVENOUS

## 2022-11-22 MED ORDER — HYDROMORPHONE HCL 1 MG/ML IJ SOLN
INTRAMUSCULAR | Status: AC
  Filled 2022-11-22: qty 1

## 2022-11-22 MED ORDER — MIDAZOLAM HCL 2 MG/2ML IJ SOLN
INTRAMUSCULAR | Status: AC
  Filled 2022-11-22: qty 2

## 2022-11-22 MED ORDER — OXYCODONE HCL 5 MG PO TABS
ORAL_TABLET | ORAL | Status: AC
  Filled 2022-11-22: qty 1

## 2022-11-22 MED ORDER — DEXAMETHASONE SODIUM PHOSPHATE 10 MG/ML IJ SOLN
INTRAMUSCULAR | Status: AC
  Filled 2022-11-22: qty 1

## 2022-11-22 MED ORDER — OXYCODONE HCL 5 MG PO TABS
10.0000 mg | ORAL_TABLET | ORAL | Status: DC | PRN
  Administered 2022-11-22 – 2022-11-28 (×22): 10 mg via ORAL
  Filled 2022-11-22 (×22): qty 2

## 2022-11-22 MED ORDER — ACETAMINOPHEN 10 MG/ML IV SOLN
1000.0000 mg | Freq: Four times a day (QID) | INTRAVENOUS | Status: AC
  Administered 2022-11-22 – 2022-11-23 (×4): 1000 mg via INTRAVENOUS
  Filled 2022-11-22 (×3): qty 100

## 2022-11-22 MED ORDER — ONDANSETRON HCL 4 MG/2ML IJ SOLN
INTRAMUSCULAR | Status: AC
  Administered 2022-11-22: 4 mg via INTRAVENOUS
  Filled 2022-11-22: qty 2

## 2022-11-22 MED ORDER — PHENYLEPHRINE 80 MCG/ML (10ML) SYRINGE FOR IV PUSH (FOR BLOOD PRESSURE SUPPORT)
PREFILLED_SYRINGE | INTRAVENOUS | Status: DC | PRN
  Administered 2022-11-22: 160 ug via INTRAVENOUS
  Administered 2022-11-22: 80 ug via INTRAVENOUS
  Administered 2022-11-22: 160 ug via INTRAVENOUS
  Administered 2022-11-22: 80 ug via INTRAVENOUS

## 2022-11-22 MED ORDER — LACTATED RINGERS IV SOLN: INTRAVENOUS | Status: DC

## 2022-11-22 MED ORDER — MONTELUKAST SODIUM 10 MG PO TABS
10.0000 mg | ORAL_TABLET | Freq: Every day | ORAL | Status: DC
  Administered 2022-11-22 – 2022-11-28 (×7): 10 mg via ORAL
  Filled 2022-11-22 (×7): qty 1

## 2022-11-22 MED ORDER — ORAL CARE MOUTH RINSE: 15.0000 mL | Freq: Once | OROMUCOSAL | Status: AC

## 2022-11-22 MED ORDER — MECLIZINE HCL 25 MG PO TABS: 25.0000 mg | ORAL_TABLET | Freq: Three times a day (TID) | ORAL | Status: DC | PRN

## 2022-11-22 MED ORDER — MAGNESIUM CITRATE PO SOLN: 1.0000 | Freq: Once | ORAL | Status: DC

## 2022-11-22 MED ORDER — CEFAZOLIN SODIUM-DEXTROSE 2-4 GM/100ML-% IV SOLN
2.0000 g | INTRAVENOUS | Status: AC
  Administered 2022-11-22: 2 g via INTRAVENOUS
  Filled 2022-11-22: qty 100

## 2022-11-22 MED ORDER — KETAMINE HCL 10 MG/ML IJ SOLN
INTRAMUSCULAR | Status: DC | PRN
  Administered 2022-11-22: 5 mg via INTRAVENOUS
  Administered 2022-11-22: 25 mg via INTRAVENOUS
  Administered 2022-11-22 (×2): 10 mg via INTRAVENOUS

## 2022-11-22 MED ORDER — PANTOPRAZOLE SODIUM 40 MG PO TBEC
40.0000 mg | DELAYED_RELEASE_TABLET | Freq: Two times a day (BID) | ORAL | Status: DC | PRN
  Administered 2022-11-24: 40 mg via ORAL
  Filled 2022-11-22: qty 1

## 2022-11-22 MED ORDER — SODIUM CHLORIDE 0.9% FLUSH
INTRAVENOUS | Status: DC | PRN
  Administered 2022-11-22: 20 mL

## 2022-11-22 MED ORDER — CHLORHEXIDINE GLUCONATE 0.12 % MT SOLN
15.0000 mL | Freq: Once | OROMUCOSAL | Status: AC
  Administered 2022-11-22: 15 mL via OROMUCOSAL

## 2022-11-22 MED ORDER — LACTATED RINGERS IV SOLN: INTRAVENOUS | Status: DC | PRN

## 2022-11-22 MED ORDER — HYDROMORPHONE HCL 1 MG/ML IJ SOLN
0.5000 mg | INTRAMUSCULAR | Status: DC | PRN
  Administered 2022-11-22: 0.5 mg via INTRAVENOUS

## 2022-11-22 MED ORDER — HYDROCODONE-ACETAMINOPHEN 5-325 MG PO TABS: 1.0000 | ORAL_TABLET | Freq: Four times a day (QID) | ORAL | 0 refills | Status: DC | PRN

## 2022-11-22 MED ORDER — ALPRAZOLAM 0.5 MG PO TABS
1.0000 mg | ORAL_TABLET | Freq: Three times a day (TID) | ORAL | Status: DC | PRN
  Administered 2022-11-23 – 2022-11-29 (×10): 1 mg via ORAL
  Filled 2022-11-22 (×10): qty 2

## 2022-11-22 MED ORDER — PROPOFOL 10 MG/ML IV BOLUS
INTRAVENOUS | Status: DC | PRN
  Administered 2022-11-22: 150 mg via INTRAVENOUS

## 2022-11-22 MED ORDER — HYDROMORPHONE HCL 1 MG/ML IJ SOLN
0.5000 mg | INTRAMUSCULAR | Status: DC | PRN
  Administered 2022-11-22 – 2022-11-28 (×22): 1 mg via INTRAVENOUS
  Filled 2022-11-22 (×22): qty 1

## 2022-11-22 MED ORDER — FENOFIBRATE 160 MG PO TABS
160.0000 mg | ORAL_TABLET | Freq: Every day | ORAL | Status: DC
  Administered 2022-11-23: 160 mg via ORAL
  Filled 2022-11-22: qty 1

## 2022-11-22 MED ORDER — BUPIVACAINE LIPOSOME 1.3 % IJ SUSP
INTRAMUSCULAR | Status: AC
  Filled 2022-11-22: qty 20

## 2022-11-22 MED ORDER — GEMFIBROZIL 600 MG PO TABS: 600.0000 mg | ORAL_TABLET | Freq: Two times a day (BID) | ORAL | Status: DC

## 2022-11-22 MED ORDER — SUCRALFATE 1 G PO TABS
1.0000 g | ORAL_TABLET | Freq: Two times a day (BID) | ORAL | Status: DC
  Administered 2022-11-22 – 2022-11-29 (×14): 1 g via ORAL
  Filled 2022-11-22 (×14): qty 1

## 2022-11-22 MED ORDER — DOCUSATE SODIUM 100 MG PO CAPS
100.0000 mg | ORAL_CAPSULE | Freq: Two times a day (BID) | ORAL | Status: DC
  Administered 2022-11-22 – 2022-11-28 (×12): 100 mg via ORAL
  Filled 2022-11-22 (×12): qty 1

## 2022-11-22 MED ORDER — BUPIVACAINE LIPOSOME 1.3 % IJ SUSP
INTRAMUSCULAR | Status: DC | PRN
  Administered 2022-11-22: 20 mL

## 2022-11-22 MED ORDER — ONDANSETRON HCL 4 MG/2ML IJ SOLN
4.0000 mg | INTRAMUSCULAR | Status: DC | PRN
  Administered 2022-11-23 – 2022-11-28 (×2): 4 mg via INTRAVENOUS
  Filled 2022-11-22 (×2): qty 2

## 2022-11-22 MED ORDER — ONDANSETRON HCL 4 MG/2ML IJ SOLN
INTRAMUSCULAR | Status: DC | PRN
  Administered 2022-11-22: 4 mg via INTRAVENOUS

## 2022-11-22 MED ORDER — STERILE WATER FOR IRRIGATION IR SOLN
Status: DC | PRN
  Administered 2022-11-22: 1000 mL

## 2022-11-22 SURGICAL SUPPLY — 71 items
ADH SKN CLS APL DERMABOND .7 (GAUZE/BANDAGES/DRESSINGS) ×1
AGENT HMST KT MTR STRL THRMB (HEMOSTASIS) ×1
APL ESCP 34 STRL LF DISP (HEMOSTASIS) ×1
APPLICATOR SURGIFLO ENDO (HEMOSTASIS) ×1 IMPLANT
BAG COUNTER SPONGE SURGICOUNT (BAG) IMPLANT
BAG SPNG CNTER NS LX DISP (BAG)
CHLORAPREP W/TINT 26 (MISCELLANEOUS) ×1 IMPLANT
CLIP LIGATING HEM O LOK PURPLE (MISCELLANEOUS) ×1 IMPLANT
CLIP LIGATING HEMO LOK XL GOLD (MISCELLANEOUS) IMPLANT
CLIP LIGATING HEMO O LOK GREEN (MISCELLANEOUS) ×2 IMPLANT
CLIP SUT LAPRA TY ABSORB (SUTURE) ×1 IMPLANT
COVER SURGICAL LIGHT HANDLE (MISCELLANEOUS) ×1 IMPLANT
COVER TIP SHEARS 8 DVNC (MISCELLANEOUS) ×1 IMPLANT
COVER TIP SHEARS 8MM DA VINCI (MISCELLANEOUS) ×1
CUTTER ECHEON FLEX ENDO 45 340 (ENDOMECHANICALS) IMPLANT
DERMABOND ADVANCED .7 DNX12 (GAUZE/BANDAGES/DRESSINGS) ×1 IMPLANT
DISSECT BALLN SPACEMKR + OVL (BALLOONS) ×1
DISSECTOR BALLN SPACEMKR + OVL (BALLOONS) ×1 IMPLANT
DRAIN CHANNEL 15F RND FF 3/16 (WOUND CARE) ×1 IMPLANT
DRAPE ARM DVNC X/XI (DISPOSABLE) ×4 IMPLANT
DRAPE COLUMN DVNC XI (DISPOSABLE) ×1 IMPLANT
DRAPE DA VINCI XI ARM (DISPOSABLE) ×4
DRAPE DA VINCI XI COLUMN (DISPOSABLE) ×1
DRAPE INCISE IOBAN 66X45 STRL (DRAPES) ×1 IMPLANT
DRAPE SHEET LG 3/4 BI-LAMINATE (DRAPES) ×1 IMPLANT
ELECT PENCIL ROCKER SW 15FT (MISCELLANEOUS) ×1 IMPLANT
ELECT REM PT RETURN 15FT ADLT (MISCELLANEOUS) ×1 IMPLANT
EVACUATOR SILICONE 100CC (DRAIN) ×1 IMPLANT
GLOVE BIO SURGEON STRL SZ 6.5 (GLOVE) ×1 IMPLANT
GLOVE SURG LX STRL 7.5 STRW (GLOVE) ×2 IMPLANT
GOWN SRG XL LVL 4 BRTHBL STRL (GOWNS) ×1 IMPLANT
GOWN STRL NON-REIN XL LVL4 (GOWNS) ×1
HEMOSTAT SURGICEL 4X8 (HEMOSTASIS) ×1 IMPLANT
IRRIG SUCT STRYKERFLOW 2 WTIP (MISCELLANEOUS) ×1
IRRIGATION SUCT STRKRFLW 2 WTP (MISCELLANEOUS) ×1 IMPLANT
KIT BASIN OR (CUSTOM PROCEDURE TRAY) ×1 IMPLANT
KIT TURNOVER KIT A (KITS) IMPLANT
LOOP VESSEL MAXI BLUE (MISCELLANEOUS) ×1 IMPLANT
MARKER SKIN DUAL TIP RULER LAB (MISCELLANEOUS) ×1 IMPLANT
NS IRRIG 1000ML POUR BTL (IV SOLUTION) ×1 IMPLANT
PORT ACCESS TROCAR AIRSEAL 12 (TROCAR) ×1 IMPLANT
PROTECTOR NERVE ULNAR (MISCELLANEOUS) ×2 IMPLANT
RELOAD STAPLE 45 2.6 WHT THIN (STAPLE) IMPLANT
SEAL CANN UNIV 5-8 DVNC XI (MISCELLANEOUS) ×4 IMPLANT
SEAL XI 5MM-8MM UNIVERSAL (MISCELLANEOUS) ×4
SET TRI-LUMEN FLTR TB AIRSEAL (TUBING) ×1 IMPLANT
SOLUTION ELECTROLUBE (MISCELLANEOUS) ×1 IMPLANT
SPIKE FLUID TRANSFER (MISCELLANEOUS) ×1 IMPLANT
SPONGE T-LAP 4X18 ~~LOC~~+RFID (SPONGE) ×1 IMPLANT
STAPLE RELOAD 45 WHT (STAPLE) ×2 IMPLANT
STAPLE RELOAD 45MM WHITE (STAPLE) ×2
SURGIFLO W/THROMBIN 8M KIT (HEMOSTASIS) ×1 IMPLANT
SUT ETHILON 3 0 PS 1 (SUTURE) ×1 IMPLANT
SUT MNCRL AB 4-0 PS2 18 (SUTURE) ×2 IMPLANT
SUT PDS AB 1 CT1 27 (SUTURE) ×2 IMPLANT
SUT V-LOC BARB 180 2/0GR6 GS22 (SUTURE)
SUT VIC AB 0 CT1 27 (SUTURE) ×4
SUT VIC AB 0 CT1 27XBRD ANTBC (SUTURE) ×4 IMPLANT
SUT VIC AB 2-0 SH 27 (SUTURE) ×2
SUT VIC AB 2-0 SH 27X BRD (SUTURE) ×2 IMPLANT
SUT VLOC BARB 180 ABS3/0GR12 (SUTURE) ×1
SUTURE V-LC BRB 180 2/0GR6GS22 (SUTURE) IMPLANT
SUTURE VLOC BRB 180 ABS3/0GR12 (SUTURE) ×1 IMPLANT
SYS BAG RETRIEVAL 10MM (BASKET) ×1
SYSTEM BAG RETRIEVAL 10MM (BASKET) ×1 IMPLANT
TOWEL OR 17X26 10 PK STRL BLUE (TOWEL DISPOSABLE) ×1 IMPLANT
TOWEL OR NON WOVEN STRL DISP B (DISPOSABLE) ×1 IMPLANT
TRAY FOLEY MTR SLVR 16FR STAT (SET/KITS/TRAYS/PACK) ×1 IMPLANT
TRAY LAPAROSCOPIC (CUSTOM PROCEDURE TRAY) ×1 IMPLANT
TROCAR Z-THREAD OPTICAL 5X100M (TROCAR) IMPLANT
WATER STERILE IRR 1000ML POUR (IV SOLUTION) ×2 IMPLANT
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
## 2022-11-22 NOTE — Brief Op Note (Signed)
11/22/2022  3:42 PM  PATIENT:  Court Joy Manter  66 y.o. female  PRE-OPERATIVE DIAGNOSIS:  RENAL MASS  POST-OPERATIVE DIAGNOSIS:  RENAL MASS  PROCEDURE:  Procedure(s) with comments: XI ROBOT ASSISTED RETROPERITONEAL   RADICAL NEPHRECTOMY (Right) - 3HRS  SURGEON:  Surgeon(s) and Role:    Alexis Frock, MD - Primary  PHYSICIAN ASSISTANT:   ASSISTANTS: Debbrah Alar PA   ANESTHESIA:   local and general  EBL:  50 mL   BLOOD ADMINISTERED:none  DRAINS:  foley to gravity    LOCAL MEDICATIONS USED:  MARCAINE     SPECIMEN:  Source of Specimen:  Rt radical nephrectomy  DISPOSITION OF SPECIMEN:  PATHOLOGY  COUNTS:  YES  TOURNIQUET:  * No tourniquets in log *  DICTATION: .Other Dictation: Dictation Number [id number]  PLAN OF CARE: Admit to inpatient   PATIENT DISPOSITION:  PACU - hemodynamically stable.   Delay start of Pharmacological VTE agent (>24hrs) due to surgical blood loss or risk of bleeding: yes

## 2022-11-22 NOTE — Anesthesia Postprocedure Evaluation (Signed)
Anesthesia Post Note  Patient: NAYLENE FOELL  Procedure(s) Performed: XI ROBOT ASSISTED RETROPERITONEAL   RADICAL NEPHRECTOMY (Right: Flank)     Patient location during evaluation: PACU Anesthesia Type: General Level of consciousness: awake and alert Pain management: pain level controlled Vital Signs Assessment: post-procedure vital signs reviewed and stable Respiratory status: spontaneous breathing, nonlabored ventilation, respiratory function stable and patient connected to nasal cannula oxygen Cardiovascular status: blood pressure returned to baseline and stable Postop Assessment: no apparent nausea or vomiting Anesthetic complications: no  No notable events documented.  Last Vitals:  Vitals:   11/22/22 1830 11/22/22 1845  BP: (!) 138/50 (!) 130/50  Pulse: 82 82  Resp: (!) 9 (!) 8  Temp:    SpO2: 100% 98%    Last Pain:  Vitals:   11/22/22 1829  TempSrc:   PainSc: 10-Worst pain ever                 Sayid Moll L Ryah Cribb

## 2022-11-22 NOTE — Anesthesia Preprocedure Evaluation (Addendum)
Anesthesia Evaluation  Patient identified by MRN, date of birth, ID band Patient awake    Reviewed: Allergy & Precautions, NPO status , Patient's Chart, lab work & pertinent test results  History of Anesthesia Complications (+) PONV and history of anesthetic complications  Airway Mallampati: II  TM Distance: >3 FB Neck ROM: Full    Dental no notable dental hx. (+) Teeth Intact, Dental Advisory Given   Pulmonary asthma , COPD,  COPD inhaler   Pulmonary exam normal breath sounds clear to auscultation       Cardiovascular hypertension, Pt. on medications Normal cardiovascular exam Rhythm:Regular Rate:Normal  TTE 2023  1. Left ventricular ejection fraction, by estimation, is 60 to 65%. The  left ventricle has normal function. The left ventricle has no regional  wall motion abnormalities. There is mild left ventricular hypertrophy.  Indeterminate diastolic filling due to  E-A fusion.   2. Right ventricular systolic function is normal. The right ventricular  size is normal. Tricuspid regurgitation signal is inadequate for assessing  PA pressure.   3. The mitral valve is normal in structure. Trivial mitral valve  regurgitation. No evidence of mitral stenosis.   4. The aortic valve is grossly normal. There is mild calcification of the  aortic valve. Aortic valve regurgitation is not visualized. No aortic  stenosis is present.   5. The inferior vena cava is normal in size with greater than 50%  respiratory variability, suggesting right atrial pressure of 3 mmHg.   Stress Test 2023   The study is normal. Findings are consistent with no ischemia. The study is low risk.   No ST deviation was noted.   LV perfusion is normal.   Left ventricular function is normal. Nuclear stress EF: 71 %. The left ventricular ejection fraction is hyperdynamic (>65%). End diastolic cavity size is normal. End systolic cavity size is normal.   Prior  study not available for comparison.     Neuro/Psych  Headaches PSYCHIATRIC DISORDERS Anxiety Depression       GI/Hepatic Neg liver ROS, hiatal hernia,GERD  ,,  Endo/Other  diabetes, Type 2, Oral Hypoglycemic AgentsHypothyroidism    Renal/GU Renal diseaseLab Results      Component                Value               Date                      CREATININE               1.80 (H)            11/13/2022                BUN                      35 (H)              11/13/2022                NA                       137                 11/13/2022                K  3.9                 11/13/2022                CL                       108                 11/13/2022                CO2                      21 (L)              11/13/2022             negative genitourinary   Musculoskeletal  (+) Arthritis ,  Fibromyalgia -  Abdominal   Peds  Hematology  (+) Blood dyscrasia, anemia Lab Results      Component                Value               Date                      WBC                      8.6                 11/13/2022                HGB                      10.9 (L)            11/13/2022                HCT                      34.7 (L)            11/13/2022                MCV                      95.1                11/13/2022                PLT                      460 (H)             11/13/2022              Anesthesia Other Findings   Reproductive/Obstetrics                             Anesthesia Physical Anesthesia Plan  ASA: 3  Anesthesia Plan: General   Post-op Pain Management: Tylenol PO (pre-op)*, Ketamine IV* and Lidocaine infusion*   Induction: Intravenous  PONV Risk Score and Plan: 4 or greater and Midazolam, Dexamethasone and Ondansetron  Airway Management Planned: Oral ETT  Additional Equipment:   Intra-op Plan:   Post-operative Plan: Extubation in OR  Informed Consent: I have reviewed the patients History and  Physical, chart, labs and discussed the procedure including the  risks, benefits and alternatives for the proposed anesthesia with the patient or authorized representative who has indicated his/her understanding and acceptance.     Dental advisory given  Plan Discussed with: CRNA  Anesthesia Plan Comments:        Anesthesia Quick Evaluation

## 2022-11-22 NOTE — Discharge Instructions (Signed)

## 2022-11-22 NOTE — Transfer of Care (Signed)
Immediate Anesthesia Transfer of Care Note  Patient: Chelsea Kerr  Procedure(s) Performed: Procedure(s) with comments: XI ROBOT ASSISTED RETROPERITONEAL   RADICAL NEPHRECTOMY (Right) - 3HRS  Patient Location: PACU  Anesthesia Type:General  Level of Consciousness: Alert, Awake, Oriented  Airway & Oxygen Therapy: Patient Spontanous Breathing  Post-op Assessment: Report given to RN  Post vital signs: Reviewed and stable  Last Vitals:  Vitals:   11/22/22 1149  BP: (!) 101/58  Pulse: 90  Resp: 16  Temp: (!) 36.4 C  SpO2: 459%    Complications: No apparent anesthesia complications

## 2022-11-22 NOTE — Anesthesia Procedure Notes (Signed)
Procedure Name: Intubation Date/Time: 11/22/2022 1:14 PM  Performed by: Josephine Rudnick D, CRNAPre-anesthesia Checklist: Patient identified, Emergency Drugs available, Suction available and Patient being monitored Patient Re-evaluated:Patient Re-evaluated prior to induction Oxygen Delivery Method: Circle system utilized Preoxygenation: Pre-oxygenation with 100% oxygen Induction Type: IV induction Ventilation: Mask ventilation without difficulty Laryngoscope Size: Mac and 4 Grade View: Grade I Tube type: Oral Tube size: 7.0 mm Number of attempts: 1 Airway Equipment and Method: Stylet and Oral airway Placement Confirmation: ETT inserted through vocal cords under direct vision, positive ETCO2 and breath sounds checked- equal and bilateral Secured at: 21 cm Tube secured with: Tape Dental Injury: Teeth and Oropharynx as per pre-operative assessment

## 2022-11-23 ENCOUNTER — Other Ambulatory Visit: Payer: Self-pay

## 2022-11-23 LAB — BASIC METABOLIC PANEL
Anion gap: 10 (ref 5–15)
BUN: 29 mg/dL — ABNORMAL HIGH (ref 8–23)
CO2: 17 mmol/L — ABNORMAL LOW (ref 22–32)
Calcium: 9.3 mg/dL (ref 8.9–10.3)
Chloride: 107 mmol/L (ref 98–111)
Creatinine, Ser: 2.16 mg/dL — ABNORMAL HIGH (ref 0.44–1.00)
GFR, Estimated: 25 mL/min — ABNORMAL LOW (ref >60)
Glucose, Bld: 133 mg/dL — ABNORMAL HIGH (ref 70–99)
Potassium: 4.7 mmol/L (ref 3.5–5.1)
Sodium: 134 mmol/L — ABNORMAL LOW (ref 135–145)

## 2022-11-23 LAB — HEMOGLOBIN AND HEMATOCRIT, BLOOD
HCT: 33.8 % — ABNORMAL LOW (ref 36.0–46.0)
Hemoglobin: 10.1 g/dL — ABNORMAL LOW (ref 12.0–15.0)

## 2022-11-23 MED ORDER — SIMETHICONE 80 MG PO CHEW
80.0000 mg | CHEWABLE_TABLET | Freq: Four times a day (QID) | ORAL | Status: DC | PRN
  Administered 2022-11-23 – 2022-11-24 (×2): 80 mg via ORAL
  Filled 2022-11-23 (×2): qty 1

## 2022-11-23 MED ORDER — FAMOTIDINE 20 MG PO TABS
20.0000 mg | ORAL_TABLET | Freq: Every day | ORAL | Status: DC
  Administered 2022-11-23 – 2022-11-28 (×6): 20 mg via ORAL
  Filled 2022-11-23 (×6): qty 1

## 2022-11-23 MED ORDER — GABAPENTIN 300 MG PO CAPS
300.0000 mg | ORAL_CAPSULE | Freq: Two times a day (BID) | ORAL | Status: DC
  Administered 2022-11-23 – 2022-11-29 (×12): 300 mg via ORAL
  Filled 2022-11-23 (×12): qty 1

## 2022-11-23 MED ORDER — ACETAMINOPHEN 500 MG PO TABS
1000.0000 mg | ORAL_TABLET | Freq: Three times a day (TID) | ORAL | Status: AC
  Administered 2022-11-23 – 2022-11-26 (×8): 1000 mg via ORAL
  Filled 2022-11-23 (×8): qty 2

## 2022-11-23 NOTE — Progress Notes (Signed)
1 Day Post-Op Subjective: No acute events overnight. Reports pain in the right flank and right shoulder. Has not ambulated. + flatus. Tearful throughout conversation.  Objective: Vital signs in last 24 hours: Temp:  [97.5 F (36.4 C)-98.2 F (36.8 C)] 98.1 F (36.7 C) (12/21 0803) Pulse Rate:  [75-90] 82 (12/21 0803) Resp:  [7-24] 19 (12/21 0803) BP: (101-160)/(40-67) 128/46 (12/21 0803) SpO2:  [92 %-100 %] 97 % (12/21 0803) FiO2 (%):  [21 %] 21 % (12/20 2025) Weight:  [76 kg-79 kg] 79 kg (12/21 0001)  Intake/Output from previous day: 12/20 0701 - 12/21 0700 In: 3330.6 [P.O.:120; I.V.:3010.6; IV Piggyback:200] Out: 225 [Urine:175; Blood:50] Intake/Output this shift: No intake/output data recorded.  Physical Exam:  General: Alert and oriented CV: RRR Lungs: Clear Abdomen: Soft, ND GU: foley catheter in place draining clear yellow. Right flank incision c/d/I, port incisions c/d/i Ext: NT, No erythema  Lab Results: Recent Labs    11/22/22 1659 11/23/22 0346  HGB 10.5* 10.1*  HCT 33.4* 33.8*   BMET Recent Labs    11/23/22 0346  NA 134*  K 4.7  CL 107  CO2 17*  GLUCOSE 133*  BUN 29*  CREATININE 2.16*  CALCIUM 9.3     Studies/Results: No results found.  Assessment/Plan: Right renal mass--s/p robotic retroperitoneal right radical nephrectomy. Post-operative pain remains the largest barrier to discharge. Continue PRN analgesia with expectation of clinical improvement over the next 24h. Will plan to [facility], advance to regular diet, and TOV today. Hopeful for discharge tomorrow as pain improves.   LOS: 1 day   Lamar Laundry 11/23/2022, 8:51 AM

## 2022-11-23 NOTE — Op Note (Signed)
NAME: Chelsea Kerr, Chelsea Kerr MEDICAL RECORD NO: [id number] ACCOUNT NO: [id number] DATE OF BIRTH: 05/26/1956 FACILITY: [facility] LOCATION: [id number] PHYSICIAN: Alexis Frock, MD  Operative Report   DATE OF PROCEDURE: 11/22/2022  PREOPERATIVE DIAGNOSES:  Right renal mass.  History of extensive abdominal surgery.  PROCEDURE:  Right retroperitoneal robotic radical nephrectomy.  ESTIMATED BLOOD LOSS:  50 mL  COMPLICATIONS:  None.  SPECIMEN:  Right radical nephrectomy.  FINDINGS:  1.  Single artery, two vein right renovascular anatomy as anticipated. 2.  Predominantly endophytic hilar posterior mass.  DRAINS:  Foley catheter to straight drain.  INDICATIONS:  The patient is a 66 year old lady with history of a significant lumbago as well as history of multiple intra-abdominal surgeries including ventral hernia repair.  She was found incidentally to have an approximately 3 to 4 cm right renal  mass, enhancing and solid concerning for localized renal cell carcinoma.  Location of the mass was very central and hilar and not amenable to nephron-sparing approach.  Options were discussed including surveillance protocols versus surgical extirpation  with radical nephrectomy being most prudent given the size and orientation of the mass and she wished to proceed with radical nephrectomy.  Given her extensive surgical history, a retroperitoneal approach was chosen to avoid the significant  intra-abdominal adhesions.  Informed consent was obtained and placed in the medical record.  PROCEDURE IN DETAIL:  The patient being herself verified, procedure being right robotic retroperitoneal radical nephrectomy was confirmed.  Procedure timeout was performed.  Intravenous antibiotics were administered.  General endotracheal anesthesia  induced.  The patient was placed into a right side up full flank position pulling 15 degrees of table flexion, superior arm elevator, axillary roll, sequential devices, bottom leg bent, top leg  straight.  Beanbag was carefully deployed, superior arm  elevator was used.  She was further fastened to table using 3-inch tape, over foam padding across supraxiphoid chest and her pelvis.  Sterile field was created by prepping and draping the patient's entire right flank and abdomen using chlorhexidine  gluconate.  Next, incision was made approximately 2.5 cm in length just inferior medial to the tip of the 12th rib and using surgeon's finger, posterior lumbodorsal fascia and musculature was dilated down to the true lumbodorsal fascia, which was pierced  with a Kelly clamp, allowing entrance into the true retroperitoneum. Using surgeon's finger, the retroperitoneum was developed just anterior to the psoas musculature, but posterior to the kidney.  Lower pole of the kidney and posterior aspect were  easily palpated with this technique and this retroperitoneal space was maximally developed.  Next, the Spacemaker balloon apparatus was advanced into the same plane under the laparoscopic vision, carefully insufflated in 10 pump increments to 50 pumps.  This resulted in excellent development of the retroperitoneal space. Via the Spacemaker balloon, the psoas musculature, psoas tendon, ureter, pulsations of the right hilar renal vessels and kidney were all noted in proper retroperitoneal orientation.   The Spacemaker balloon was then taken back down and additional ports were placed as follows:  Right medial subcostal robotic port.  This was just inferior to the 12th rib and the junction between the 12th rib and psoas musculature. Right inferior-medial  assistant port AirSeal type.  This was approximately 3 cm inferior-medial to the plane of the entrance incision and just lateral to the psoas musculature, an 8 mm robotic port inferior-medial.  This was placed approximately 1 fingerbreadth  inferior-medial to the entry site.  This was placed directly on the surgeon's finger to  corroborate retroperitoneal plane and  finally a superior-medial robotic port also approximately 1 full finger length away from the entrance site, again directly on  the surgeon's finger to appropriate retroperitoneal orientation.  The retroperitoneal balloon port was then placed, 15 mL of air into the balloon and compressed on it to form compressive ring. Then, using a piggyback technique, 8 mm robotic camera port  placed through this.  Robot was docked and passed the electronic checks.  Next, attention was directed at further identification of retroperitoneal structures.  The psoas was used as an inferior horizontal plane.  The kidney was identified as was the  prominently posterior and hilar mass that did also appear grossly very solid.  This kidney was placed on lateral anterior traction.  The ureter was identified and the plane between the psoas musculature and ureter was further developed towards the renal  hilum.  Renal hilum consisted of a single dominant artery  and a dominant vein just medial to this with small accessory vein inferior. Artery was controlled using medium Hem-o-lok clip proximal, vascular stapler distal.  The dominant renal vein  controlled using vascular stapler and the small accessory lower pole vein controlled using Hem-o-Lok clip x2 proximal, x1 distal.  This resulted in excellent hemostatic control of the renal hilum and the medial plane was further developed directly on the  lateral surface of the inferior vena cava, superiorly to the area just superior to the adrenal gland and then inferiorly passing the lower pole of the kidney.  I was quite happy with the medial plane of dissection.  The dissection remained  retroperitoneal in its entirety.  The duodenum was also visualized and uninjured medially. The kidney was then taken off of the lateral stretch and the plane anterior to the kidney was carefully developed.  Again, keeping the peritoneum fully intact and  then carrying this medially and inferior. This  completely freed up the right radical nephrectomy. Specimen was placed into EndoCatch bag for later retrieval.  Sponge and needle counts were correct.  Hemostasis was excellent.  We achieved the goals of the  extirpative portion of the surgery today.  Robot was then undocked.  Specimen was retrieved by connecting the previous entry port site and assistant port site. Right radical nephrectomy specimen was removed, set aside for permanent pathology.  The  posterior lumbodorsal fascia and muscle layers were then reapproximated using figure-of-eight PDS x4 followed by two separate subcutaneous running layers of Vicryl.  All incision sites were infiltrated with dilute lipolyzed Marcaine and then closed at  the level of the skin using subcuticular Monocryl followed by Dermabond.  Procedure was then terminated.  The patient tolerated procedure well, no immediate perioperative complications.  The patient was taken to Postanesthesia Care Unit in stable  condition with plan for inpatient admission.  Please note, first assistant, Debbrah Alar, was crucial for all portions of surgery today.  She provided invaluable retraction, suctioning, vascular clipping, vascular stapling, robotic instrument exchange and general first assistance.      PAA D: 11/22/2022 3:51:51 pm T: 11/23/2022 12:38:00 am  JOB: [id number]/ [id number]

## 2022-11-23 NOTE — Progress Notes (Signed)
Pt up to bsc and voided at this time.

## 2022-11-23 NOTE — Progress Notes (Signed)
Mobility Specialist - Progress Note   11/23/22 1423  Mobility  Activity Ambulated with assistance in room  Level of Assistance +2 (takes two people)  Assistive Device None  Distance Ambulated (ft) 10 ft  Range of Motion/Exercises Active  Activity Response Tolerated well  Mobility Referral Yes  $Mobility charge 1 Mobility   Entered room with RN to ambulate pt and she was agreeable to try ambulation with help from both. Pt got up from bed and and was able to ambulate with +2 assistance for safety and c/o abdominal pain throughout session. At EOS was left in bed with all necessities in reach.  Ferd Hibbs Mobility Specialist

## 2022-11-23 NOTE — Progress Notes (Signed)
Have spoken to patient several times about getting out of bed and ambulating, provided education as well pt is refusing to ambulate or move because of being in to much pain. Pain medications and anxiety medications have been given as well

## 2022-11-24 LAB — BASIC METABOLIC PANEL
Anion gap: 8 (ref 5–15)
BUN: 32 mg/dL — ABNORMAL HIGH (ref 8–23)
CO2: 17 mmol/L — ABNORMAL LOW (ref 22–32)
Calcium: 8.4 mg/dL — ABNORMAL LOW (ref 8.9–10.3)
Chloride: 106 mmol/L (ref 98–111)
Creatinine, Ser: 2.08 mg/dL — ABNORMAL HIGH (ref 0.44–1.00)
GFR, Estimated: 26 mL/min — ABNORMAL LOW (ref >60)
Glucose, Bld: 132 mg/dL — ABNORMAL HIGH (ref 70–99)
Potassium: 4 mmol/L (ref 3.5–5.1)
Sodium: 131 mmol/L — ABNORMAL LOW (ref 135–145)

## 2022-11-24 LAB — SURGICAL PATHOLOGY

## 2022-11-24 LAB — HEMOGLOBIN AND HEMATOCRIT, BLOOD
HCT: 25.9 % — ABNORMAL LOW (ref 36.0–46.0)
Hemoglobin: 8 g/dL — ABNORMAL LOW (ref 12.0–15.0)

## 2022-11-24 NOTE — Progress Notes (Signed)
2 Days Post-Op Subjective: No acute events overnight. Continued pain in right flank and right shoulder. Ambulating only within room. + flatus but no BM. Low grade temp 100.1.  Objective: Vital signs in last 24 hours: Temp:  [98 F (36.7 C)-100.1 F (37.8 C)] 100.1 F (37.8 C) (12/22 0535) Pulse Rate:  [82-107] 107 (12/22 0540) Resp:  [18-19] 18 (12/22 0535) BP: (107-128)/(41-71) 121/71 (12/22 0535) SpO2:  [88 %-97 %] 95 % (12/22 0540)  Intake/Output from previous day: 12/21 0701 - 12/22 0700 In: -  Out: 1425 [Urine:1425] Intake/Output this shift: No intake/output data recorded.  Physical Exam:  General: Alert and oriented CV: RRR Lungs: Clear Abdomen: Soft, ND GU: Voiding spontaneously. Right flank incision c/d/I, port incisions c/d/i Ext: NT, No erythema  Lab Results: Recent Labs    11/22/22 1659 11/23/22 0346 11/24/22 0702  HGB 10.5* 10.1* 8.0*  HCT 33.4* 33.8* 25.9*    BMET Recent Labs    11/23/22 0346  NA 134*  K 4.7  CL 107  CO2 17*  GLUCOSE 133*  BUN 29*  CREATININE 2.16*  CALCIUM 9.3      Studies/Results: No results found.  Assessment/Plan: Right renal mass--s/p robotic retroperitoneal right radical nephrectomy. Post-operative pain remains the largest barrier to discharge. Continue PRN analgesia with expectation of clinical improvement over time. Will plan to ambulate more today, encouraged use of IS while supine in bed. Hopeful for discharge today vs tomorrow as pain improves. Slight drop in Hgb to 8.0 from 10.1. Otherwise hemodynamically stable with no evidence of flank ecchymosis or hematoma. Will plan to recheck H/H on AM labs tomorrow   LOS: 2 days   Lamar Laundry 11/24/2022, 7:37 AM

## 2022-11-24 NOTE — Plan of Care (Signed)
  Problem: Education: Goal: Knowledge of the prescribed therapeutic regimen will improve Outcome: Progressing   Problem: Bowel/Gastric: Goal: Gastrointestinal status for postoperative course will improve Outcome: Progressing   Problem: Clinical Measurements: Goal: Postoperative complications will be avoided or minimized Outcome: Progressing   Problem: Respiratory: Goal: Ability to achieve and maintain a regular respiratory rate will improve Outcome: Progressing   Problem: Skin Integrity: Goal: Demonstration of wound healing without infection will improve Outcome: Progressing   Problem: Urinary Elimination: Goal: Ability to avoid or minimize complications of infection will improve Outcome: Progressing Goal: Ability to achieve and maintain urine output will improve Outcome: Progressing   Problem: Education: Goal: Knowledge of General Education information will improve Description: Including pain rating scale, medication(s)/side effects and non-pharmacologic comfort measures Outcome: Progressing   Problem: Health Behavior/Discharge Planning: Goal: Ability to manage health-related needs will improve Outcome: Progressing   Problem: Clinical Measurements: Goal: Ability to maintain clinical measurements within normal limits will improve Outcome: Progressing Goal: Will remain free from infection Outcome: Progressing Goal: Diagnostic test results will improve Outcome: Progressing Goal: Respiratory complications will improve Outcome: Progressing Goal: Cardiovascular complication will be avoided Outcome: Progressing   Problem: Activity: Goal: Risk for activity intolerance will decrease Outcome: Progressing   Problem: Nutrition: Goal: Adequate nutrition will be maintained Outcome: Progressing   Problem: Coping: Goal: Level of anxiety will decrease Outcome: Progressing   Problem: Elimination: Goal: Will not experience complications related to bowel motility Outcome:  Progressing Goal: Will not experience complications related to urinary retention Outcome: Progressing   Problem: Pain Managment: Goal: General experience of comfort will improve Outcome: Progressing   Problem: Safety: Goal: Ability to remain free from injury will improve Outcome: Progressing   Problem: Skin Integrity: Goal: Risk for impaired skin integrity will decrease Outcome: Progressing

## 2022-11-24 NOTE — Progress Notes (Signed)
Mobility Specialist - Progress Note   11/24/22 1157  Mobility  Activity Ambulated with assistance in hallway  Level of Assistance Minimal assist, patient does 75% or more  Assistive Device Front wheel walker  Distance Ambulated (ft) 50 ft  Range of Motion/Exercises Active  Activity Response Tolerated well  Mobility Referral Yes  $Mobility charge 1 Mobility   Pt was found on recliner chair and agreeable to ambulate. C/o abdominal pain throughout session stating it was worse today since she had been up more. Was min-A from sit to stand and contact guard for ambulation. During ambulation she stated feeling like she had LOB and was unsteady. At EOS returned to recliner chair with necessities in reach and RN notified of session.  Ferd Hibbs Mobility Specialist

## 2022-11-25 LAB — BASIC METABOLIC PANEL
Anion gap: 7 (ref 5–15)
BUN: 31 mg/dL — ABNORMAL HIGH (ref 8–23)
CO2: 18 mmol/L — ABNORMAL LOW (ref 22–32)
Calcium: 8.6 mg/dL — ABNORMAL LOW (ref 8.9–10.3)
Chloride: 106 mmol/L (ref 98–111)
Creatinine, Ser: 2.03 mg/dL — ABNORMAL HIGH (ref 0.44–1.00)
GFR, Estimated: 27 mL/min — ABNORMAL LOW (ref >60)
Glucose, Bld: 156 mg/dL — ABNORMAL HIGH (ref 70–99)
Potassium: 4.2 mmol/L (ref 3.5–5.1)
Sodium: 131 mmol/L — ABNORMAL LOW (ref 135–145)

## 2022-11-25 LAB — HEMOGLOBIN AND HEMATOCRIT, BLOOD
HCT: 23.7 % — ABNORMAL LOW (ref 36.0–46.0)
Hemoglobin: 7.5 g/dL — ABNORMAL LOW (ref 12.0–15.0)

## 2022-11-25 NOTE — Progress Notes (Signed)
3 Days Post-Op Subjective: No acute events overnight. Continued pain in right flank and right shoulder. Ambulated around hospital floor last night. + flatus but no BM.   Objective: Vital signs in last 24 hours: Temp:  [99.3 F (37.4 C)-100.2 F (37.9 C)] 99.3 F (37.4 C) (12/23 0533) Pulse Rate:  [92-110] 92 (12/23 0533) Resp:  [17-20] 18 (12/23 0533) BP: (115-126)/(37-50) 126/50 (12/23 0533) SpO2:  [91 %-97 %] 91 % (12/23 0533)  Intake/Output from previous day: 12/22 0701 - 12/23 0700 In: 1080 [P.O.:1080] Out: -  Intake/Output this shift: No intake/output data recorded.  Physical Exam:  General: Alert and oriented CV: RRR Lungs: Clear Abdomen: Soft, ND GU: Voiding spontaneously. Right flank incision c/d/I, port incisions c/d/i Ext: NT, No erythema  Lab Results: Recent Labs    11/23/22 0346 11/24/22 0702 11/25/22 0507  HGB 10.1* 8.0* 7.5*  HCT 33.8* 25.9* 23.7*    BMET Recent Labs    11/24/22 0702 11/25/22 0507  NA 131* 131*  K 4.0 4.2  CL 106 106  CO2 17* 18*  GLUCOSE 132* 156*  BUN 32* 31*  CREATININE 2.08* 2.03*  CALCIUM 8.4* 8.6*      Studies/Results: No results found.  Assessment/Plan: Right renal mass--s/p robotic retroperitoneal right radical nephrectomy.   -patient interested in going home today after walking again this AM, which I feel is reasonable   LOS: 3 days   Kaylani Fromme L Shulamis Wenberg 11/25/2022, 8:29 AM

## 2022-11-25 NOTE — Evaluation (Signed)
Physical Therapy Evaluation Patient Details Name: Chelsea Kerr MRN: [id number] DOB: [birth year] Today's Date: 11/25/2022  History of Present Illness  66 yo female s/p RIGHT retroperitoneal nephrectomy for renal neoplasm on 11/22/22. PMH: COPD, HTN, abd surgery  Clinical Impression  Pt admitted with above diagnosis.  Pt became diaphoretic while amb in hallway requiring seated rest in desk chair, VSS once pt assisted back to bed, deferred check in chair d/t safety concerns; BP on soft side this date, Hgb 7.5 and Na+ 131 possibly contributing factors; pt reports mild nausea and states she has not been eating much today.  Will continue efforts to mobilize, mobility team following also. Pt is motivated to d/c home, may benefit from [facility], has DME  Pt currently with functional limitations due to the deficits listed below (see PT Problem List). Pt will benefit from skilled PT to increase their independence and safety with mobility to allow discharge to the venue listed below.          Recommendations for follow up therapy are one component of a multi-disciplinary discharge planning process, led by the attending physician.  Recommendations may be updated based on patient status, additional functional criteria and insurance authorization.  Follow Up Recommendations Home health PT      Assistance Recommended at Discharge Intermittent Supervision/Assistance  Patient can return home with the following  A little help with bathing/dressing/bathroom;Assist for transportation;Assistance with cooking/housework;Help with stairs or ramp for entrance    Equipment Recommendations None recommended by PT  Recommendations for Other Services       Functional Status Assessment Patient has had a recent decline in their functional status and demonstrates the ability to make significant improvements in function in a reasonable and predictable amount of time.     Precautions / Restrictions  Precautions Precautions: Fall Restrictions Weight Bearing Restrictions: No      Mobility  Bed Mobility Overal bed mobility: Needs Assistance Bed Mobility: Rolling, Sidelying to Sit, Sit to Supine Rolling: Min guard Sidelying to sit: Min guard, Min assist   Sit to supine: Min guard   General bed mobility comments: assist to elevate trunk    Transfers Overall transfer level: Needs assistance Equipment used: Rolling walker (2 wheels) Transfers: Sit to/from Stand Sit to Stand: Min guard           General transfer comment: cues for hand placement    Ambulation/Gait Ambulation/Gait assistance: Min assist Gait Distance (Feet): 60 Feet Assistive device: Rolling walker (2 wheels) Gait Pattern/deviations: Step-through pattern       General Gait Details: initially min/guard, min assist to steady with incr distance--pt became diaphoretic and required seated rest in rolling desk chair; pt rolled back to room, stated she felt like she was going to pass out and was assisted back to bed by PT and her grand-dtr; no LOC, BP/VS stable on return to bed.  Stairs            Wheelchair Mobility    Modified Rankin (Stroke Patients Only)       Balance Overall balance assessment: Needs assistance Sitting-balance support: Feet supported, No upper extremity supported Sitting balance-Leahy Scale: Good       Standing balance-Leahy Scale: Fair                               Pertinent Vitals/Pain Pain Assessment Pain Assessment: Faces Faces Pain Scale: Hurts little more Pain Location: right flank Pain Descriptors / Indicators: Grimacing,  Sore Pain Intervention(s): Limited activity within patient's tolerance, Monitored during session, Premedicated before session, Repositioned    Home Living Family/patient expects to be discharged to:: Private residence Living Arrangements: Other relatives (sister) Available Help at Discharge: Family;Available  PRN/intermittently Type of Home: House Home Access: Level entry       Home Layout: One level Home Equipment: Conservation officer, nature (2 wheels);Cane - single point;BSC/3in1 Additional Comments: lives with sister who cannot physicall assist but can help with houselhold duties; grand-dtr supportive    Prior Function Prior Level of Function : Independent/Modified Independent             Mobility Comments: amb with RW       Hand Dominance        Extremity/Trunk Assessment   Upper Extremity Assessment Upper Extremity Assessment: Overall WFL for tasks assessed    Lower Extremity Assessment Lower Extremity Assessment: Overall WFL for tasks assessed       Communication   Communication: No difficulties  Cognition Arousal/Alertness: Awake/alert Behavior During Therapy: WFL for tasks assessed/performed Overall Cognitive Status: Within Functional Limits for tasks assessed                                          General Comments      Exercises     Assessment/Plan    PT Assessment Patient needs continued PT services  PT Problem List Decreased activity tolerance;Decreased balance;Decreased mobility;Decreased knowledge of precautions       PT Treatment Interventions DME instruction;Therapeutic exercise;Gait training;Functional mobility training;Therapeutic activities;Patient/family education    PT Goals (Current goals can be found in the Care Plan section)  Acute Rehab PT Goals Patient Stated Goal: home soon! PT Goal Formulation: With patient Time For Goal Achievement: 12/09/22 Potential to Achieve Goals: Good    Frequency Min 3X/week     Co-evaluation               AM-PAC PT "6 Clicks" Mobility  Outcome Measure Help needed turning from your back to your side while in a flat bed without using bedrails?: A Little Help needed moving from lying on your back to sitting on the side of a flat bed without using bedrails?: A Little Help needed moving  to and from a bed to a chair (including a wheelchair)?: A Little Help needed standing up from a chair using your arms (e.g., wheelchair or bedside chair)?: A Little Help needed to walk in hospital room?: A Little Help needed climbing 3-5 steps with a railing? : A Lot 6 Click Score: 17    End of Session   Activity Tolerance: Treatment limited secondary to medical complications (Comment) (diaphoresis) Patient left: in bed;with call bell/phone within reach;with bed alarm set;with family/visitor present Nurse Communication: Mobility status PT Visit Diagnosis: Other abnormalities of gait and mobility (R26.89);Difficulty in walking, not elsewhere classified (R26.2)    Time: 2025-4270 PT Time Calculation (min) (ACUTE ONLY): 22 min   Charges:   PT Evaluation $PT Eval Low Complexity: [place], PT  Acute Rehab Dept [facility]) [phone]  [facility] Weekend Pager [facility] only)  [phone]  11/25/2022   [facility] 11/25/2022, 3:26 PM

## 2022-11-26 LAB — BASIC METABOLIC PANEL
Anion gap: 6 (ref 5–15)
BUN: 25 mg/dL — ABNORMAL HIGH (ref 8–23)
CO2: 20 mmol/L — ABNORMAL LOW (ref 22–32)
Calcium: 9.2 mg/dL (ref 8.9–10.3)
Chloride: 107 mmol/L (ref 98–111)
Creatinine, Ser: 1.57 mg/dL — ABNORMAL HIGH (ref 0.44–1.00)
GFR, Estimated: 36 mL/min — ABNORMAL LOW (ref >60)
Glucose, Bld: 149 mg/dL — ABNORMAL HIGH (ref 70–99)
Potassium: 4.7 mmol/L (ref 3.5–5.1)
Sodium: 133 mmol/L — ABNORMAL LOW (ref 135–145)

## 2022-11-26 LAB — HEMOGLOBIN AND HEMATOCRIT, BLOOD
HCT: 24 % — ABNORMAL LOW (ref 36.0–46.0)
Hemoglobin: 7.4 g/dL — ABNORMAL LOW (ref 12.0–15.0)

## 2022-11-26 NOTE — Progress Notes (Signed)
Reviewed chart- PT note Spoke to pt and nurse Labs stable D/c home

## 2022-11-26 NOTE — Progress Notes (Signed)
RN walked in and pt was whaling stating "I hurt so bad I feel like I am going to pass out." RN asked if onset was sudden, pt stated "no it has been like this the whole time if they make me leave I am going to come back." RN questioned why she told MD she was ready to leave and pt could not answer. MD notified. Will continue to monitor.

## 2022-11-26 NOTE — Discharge Summary (Signed)
Date of admission: 11/22/2022  Date of discharge: 11/26/2022  Admission diagnosis: right renal mass  Discharge diagnosis: right renal cell carcinoma  Secondary diagnoses: COPD  History and Physical: For full details, please see admission history and physical. Briefly, Chelsea Kerr is a 66 y.o. year old patient with the above diagnosis.   Hospital Course: Robotic radical nephrectomy; post op pain; ambulated OK; Hb stable 7.4   Laboratory values:  Recent Labs    11/24/22 0702 11/25/22 0507 11/26/22 0737  HGB 8.0* 7.5* 7.4*  HCT 25.9* 23.7* 24.0*   Recent Labs    11/25/22 0507 11/26/22 0737  CREATININE 2.03* 1.57*    Disposition: Home  Discharge instruction: The patient was instructed to be ambulatory but told to refrain from heavy lifting, strenuous activity, or driving. reviewed  Discharge medications:  Allergies as of 11/26/2022       Reactions   Nsaids Other (See Comments)   Told to avoid due to kidney function        Medication List     TAKE these medications    albuterol (2.5 MG/3ML) 0.083% nebulizer solution Commonly known as: PROVENTIL Take 2.5 mg by nebulization every 6 (six) hours as needed for wheezing or shortness of breath.   albuterol 108 (90 Base) MCG/ACT inhaler Commonly known as: VENTOLIN HFA Inhale 2 puffs into the lungs every 4 (four) hours as needed for wheezing or shortness of breath. For shortness of breath   ALPRAZolam 1 MG tablet Commonly known as: XANAX Take 1 mg by mouth 3 (three) times daily as needed for anxiety.   ASCORBIC ACID PO Take 1 tablet by mouth daily.   aspirin EC 81 MG tablet Take 81 mg by mouth daily.   buPROPion 300 MG 24 hr tablet Commonly known as: WELLBUTRIN XL Take 300 mg by mouth daily.   busPIRone 15 MG tablet Commonly known as: BUSPAR Take 1 tablet (15 mg total) by mouth 3 (three) times daily.   cyclobenzaprine 10 MG tablet Commonly known as: FLEXERIL Take 1 tablet (10 mg total) by mouth 2 (two)  times daily as needed for muscle spasms. What changed: when to take this   docusate sodium 100 MG capsule Commonly known as: COLACE Take 1 capsule (100 mg total) by mouth 2 (two) times daily.   famotidine 40 MG tablet Commonly known as: PEPCID Take 1 tablet (40 mg total) by mouth at bedtime.   fenofibrate 145 MG tablet Commonly known as: TRICOR Take 145 mg by mouth daily.   Fluticasone-Salmeterol 500-50 MCG/DOSE Aepb Commonly known as: ADVAIR Inhale 1 puff into the lungs 2 (two) times daily.   gabapentin 600 MG tablet Commonly known as: NEURONTIN Take 0.5 tablets (300 mg total) by mouth 3 (three) times daily.   gemfibrozil 600 MG tablet Commonly known as: LOPID Take 1 tablet (600 mg total) by mouth 2 (two) times daily before a meal.   glimepiride 2 MG tablet Commonly known as: AMARYL Take 1 tablet (2 mg total) by mouth daily with breakfast. What changed: additional instructions   HYDROcodone-acetaminophen 5-325 MG tablet Commonly known as: Norco Take 1-2 tablets by mouth every 6 (six) hours as needed for moderate pain or severe pain.   levothyroxine 175 MCG tablet Commonly known as: SYNTHROID Take 175 mcg by mouth daily before breakfast.   lisinopril-hydrochlorothiazide 20-25 MG tablet Commonly known as: ZESTORETIC Take 1 tablet by mouth daily.   meclizine 25 MG tablet Commonly known as: ANTIVERT Take 1 tablet (25 mg total) by mouth  3 (three) times daily as needed for dizziness.   Multivitamin Gummies Womens [employer] 1 tablet by mouth daily.   omega-3 acid ethyl esters 1 g capsule Commonly known as: LOVAZA Take 2 g by mouth 2 (two) times daily.   OYSTER SHELL PO Take 1 tablet by mouth daily.   pantoprazole 40 MG tablet Commonly known as: PROTONIX Take 2 tablets (80 mg total) by mouth 2 (two) times daily. What changed:  how much to take when to take this reasons to take this   rosuvastatin 10 MG tablet Commonly known as: CRESTOR Take 10 mg by mouth  at bedtime.   sucralfate 1 g tablet Commonly known as: CARAFATE Take 1 g by mouth 2 (two) times daily.   VITAMIN D-3 PO Take 1 capsule by mouth daily.   zafirlukast 20 MG tablet Commonly known as: ACCOLATE Take 20 mg by mouth daily.   ZINC PO Take 1 tablet by mouth daily.   zolpidem 10 MG tablet Commonly known as: AMBIEN Take 10 mg by mouth at bedtime as needed for sleep.        Followup:   Follow-up Information     Alexis Frock, MD Follow up on 12/07/2022.   Specialty: Urology Why: at 10:30 for MD visit and pathology review Contact information: [place] [address] [phone]         Alexis Frock, MD Follow up.   Specialty: Urology Why: As scheduled Contact information: [place] [address] [phone]

## 2022-11-26 NOTE — TOC Transition Note (Signed)
Transition of Care [facility]) - CM/SW Discharge Note   Patient Details  Name: Chelsea Kerr MRN: [id number] Date of Birth: 24-Jul-1956  [facility] CM/SW Contact:  Lennart Pall, LCSW Phone Number: 11/26/2022, 2:14 PM   Clinical Narrative:    Met with pt to review recommendation for HHPT and she is agreeable.  No HH agency preference and referral placed with [facility].  Pt has needed DME at home.  No further TOC needs.   Final next level of care: [place] Barriers to Discharge: Continued Medical Work up   Patient Goals and CMS Choice      Discharge Placement                         Discharge Plan and Services Additional resources added to the After Visit Summary for                  DME Arranged: N/A DME Agency: NA       HH Arranged: PT HH Agency: [place] Date [place]: 11/26/22 Time [place]: 1414 Representative spoke with at [place]: [place] Determinants of Health ([place]) Interventions [place]: No Food Insecurity (11/23/2022)  Housing: Low Risk  (11/23/2022)  Transportation Needs: No Transportation Needs (11/23/2022)  Utilities: Not At Risk (11/23/2022)  Tobacco Use: Low Risk  (11/22/2022)     Readmission Risk Interventions    11/26/2022    2:13 PM  Readmission Risk Prevention Plan  Transportation Screening Complete  PCP or Specialist Appt within 5-7 Days Complete  Home Care Screening Complete  Medication Review (RN CM) Complete

## 2022-11-27 LAB — BASIC METABOLIC PANEL
Anion gap: 8 (ref 5–15)
BUN: 20 mg/dL (ref 8–23)
CO2: 21 mmol/L — ABNORMAL LOW (ref 22–32)
Calcium: 9.3 mg/dL (ref 8.9–10.3)
Chloride: 107 mmol/L (ref 98–111)
Creatinine, Ser: 1.69 mg/dL — ABNORMAL HIGH (ref 0.44–1.00)
GFR, Estimated: 33 mL/min — ABNORMAL LOW (ref >60)
Glucose, Bld: 162 mg/dL — ABNORMAL HIGH (ref 70–99)
Potassium: 5.1 mmol/L (ref 3.5–5.1)
Sodium: 136 mmol/L (ref 135–145)

## 2022-11-27 LAB — HEMOGLOBIN AND HEMATOCRIT, BLOOD
HCT: 23.7 % — ABNORMAL LOW (ref 36.0–46.0)
Hemoglobin: 7.1 g/dL — ABNORMAL LOW (ref 12.0–15.0)

## 2022-11-27 NOTE — Progress Notes (Signed)
Patient did not want to go home Sunday- initially said she wanted to Crying pain but settled Taking dilaudid regularly  Encouraging ambulation Incisions good Points to posterior right flank as source of pain Belly soft but bit tender posteriorly right flank Recheck labs and observe

## 2022-11-28 LAB — HEMOGLOBIN AND HEMATOCRIT, BLOOD
HCT: 21.5 % — ABNORMAL LOW (ref 36.0–46.0)
Hemoglobin: 6.6 g/dL — CL (ref 12.0–15.0)

## 2022-11-28 LAB — BASIC METABOLIC PANEL
Anion gap: 7 (ref 5–15)
BUN: 18 mg/dL (ref 8–23)
CO2: 22 mmol/L (ref 22–32)
Calcium: 9.5 mg/dL (ref 8.9–10.3)
Chloride: 107 mmol/L (ref 98–111)
Creatinine, Ser: 1.5 mg/dL — ABNORMAL HIGH (ref 0.44–1.00)
GFR, Estimated: 38 mL/min — ABNORMAL LOW (ref >60)
Glucose, Bld: 154 mg/dL — ABNORMAL HIGH (ref 70–99)
Potassium: 4.8 mmol/L (ref 3.5–5.1)
Sodium: 136 mmol/L (ref 135–145)

## 2022-11-28 LAB — PREPARE RBC (CROSSMATCH)

## 2022-11-28 MED ORDER — ACETAMINOPHEN 500 MG PO TABS
1000.0000 mg | ORAL_TABLET | Freq: Four times a day (QID) | ORAL | Status: DC
  Administered 2022-11-28 – 2022-11-29 (×5): 1000 mg via ORAL
  Filled 2022-11-28 (×5): qty 2

## 2022-11-28 MED ORDER — SODIUM CHLORIDE 0.9% IV SOLUTION: Freq: Once | INTRAVENOUS | Status: AC

## 2022-11-28 MED ORDER — BISACODYL 10 MG RE SUPP
10.0000 mg | Freq: Every day | RECTAL | Status: DC | PRN
  Administered 2022-11-29: 10 mg via RECTAL
  Filled 2022-11-28: qty 1

## 2022-11-28 MED ORDER — SENNOSIDES-DOCUSATE SODIUM 8.6-50 MG PO TABS
1.0000 | ORAL_TABLET | Freq: Two times a day (BID) | ORAL | Status: DC
  Administered 2022-11-28 – 2022-11-29 (×3): 1 via ORAL
  Filled 2022-11-28 (×3): qty 1

## 2022-11-28 MED ORDER — OXYCODONE HCL 5 MG PO TABS
15.0000 mg | ORAL_TABLET | ORAL | Status: DC | PRN
  Administered 2022-11-28 – 2022-11-29 (×4): 15 mg via ORAL
  Filled 2022-11-28 (×5): qty 3

## 2022-11-28 NOTE — Progress Notes (Signed)
MD Louis Meckel made aware of patient's critical Hgb 6.6. Will await new orders. Also, patient has not had a BM since before surgery. Pt is passing gas. Per MD Louis Meckel, OK to release PRN Urology order Dulcolax suppository. Will continue to monitor.

## 2022-11-28 NOTE — Progress Notes (Signed)
Patient has ambulated to BR 3-4x this shift with only minimal assist and RW. RN has offered to ambulate in the hallway multiple times and pt declined. RN attempted to educate pt that ambulating and movement will assist with quicker recovery, but pt stated "that has nothing to do with this." Pt consistently c/o pain rated 10/10 to right side. PRN Oxy given as requested. IS placed beside of patient and patient has used multiple times, as high as 1250. Pt requested to take a shower at some point- BSC arranged in the shower to be used as a sitting bench but pt requested to wait until later to take a shower. Will continue to monitor and encourage pt.

## 2022-11-28 NOTE — Progress Notes (Signed)
6 Days Post-Op Subjective: No acute events overnight. Continued pain in right flank and right shoulder. Ambulating. + flatus. Tolerating PO in small amounts  Objective: Vital signs in last 24 hours: Temp:  [99.1 F (37.3 C)-99.8 F (37.7 C)] 99.8 F (37.7 C) (12/26 0508) Pulse Rate:  [97-103] 98 (12/26 0508) Resp:  [18-21] 18 (12/26 0508) BP: (130-163)/(52-60) 140/58 (12/26 0508) SpO2:  [94 %-99 %] 99 % (12/26 0508)  Intake/Output from previous day: 12/25 0701 - 12/26 0700 In: 360 [P.O.:360] Out: -  Intake/Output this shift: No intake/output data recorded.  Physical Exam:  General: Alert and oriented CV: RRR Lungs: Clear Abdomen: Soft, ND GU: Voiding spontaneously. Right flank incision c/d/I, port incisions c/d/i Ext: NT, No erythema  Lab Results: Recent Labs    11/26/22 0737 11/27/22 1041  HGB 7.4* 7.1*  HCT 24.0* 23.7*    BMET Recent Labs    11/26/22 0737 11/27/22 1041  NA 133* 136  K 4.7 5.1  CL 107 107  CO2 20* 21*  GLUCOSE 149* 162*  BUN 25* 20  CREATININE 1.57* 1.69*  CALCIUM 9.2 9.3      Studies/Results: No results found.  Assessment/Plan: Right renal mass--s/p robotic retroperitoneal right radical nephrectomy. Post-operative pain remains the largest barrier to discharge. Continue PRN analgesia with expectation of clinical improvement over time. Continue to ambulate Post-operative anemia--Hgb 7.1 yesterday. Repeat H/H today, transfuse for Hgb < 7.0   LOS: 6 days   Lamar Laundry 11/28/2022, 7:32 AM

## 2022-11-29 LAB — HEMOGLOBIN AND HEMATOCRIT, BLOOD
HCT: 25.3 % — ABNORMAL LOW (ref 36.0–46.0)
Hemoglobin: 7.8 g/dL — ABNORMAL LOW (ref 12.0–15.0)

## 2022-11-29 LAB — BASIC METABOLIC PANEL
Anion gap: 7 (ref 5–15)
BUN: 17 mg/dL (ref 8–23)
CO2: 24 mmol/L (ref 22–32)
Calcium: 9.7 mg/dL (ref 8.9–10.3)
Chloride: 108 mmol/L (ref 98–111)
Creatinine, Ser: 1.52 mg/dL — ABNORMAL HIGH (ref 0.44–1.00)
GFR, Estimated: 38 mL/min — ABNORMAL LOW (ref >60)
Glucose, Bld: 130 mg/dL — ABNORMAL HIGH (ref 70–99)
Potassium: 5.2 mmol/L — ABNORMAL HIGH (ref 3.5–5.1)
Sodium: 139 mmol/L (ref 135–145)

## 2022-11-29 NOTE — Discharge Summary (Signed)
Date of admission: 11/22/2022  Date of discharge: 11/29/2022  Admission diagnosis: right renal mass  Discharge diagnosis: right renal mass  Secondary diagnoses: None  History and Physical: For full details, please see admission history and physical. Briefly, Chelsea Kerr is a 65 y.o. year old patient with a right renal mass who presented for robotic retroperitoneal right radical nephrectomy.   Hospital Course: Patient underwent robotic assisted right retroperitoneal radical nephrectomy with Dr. Tresa Moore on 11/22/22. She tolerated the procedure well and post-operatively was extubated and taken to the PACU for routine post-operative care. Once stable, she was transferred to the floor. Patient struggled with pain control post-operatively that restricted ambulation early post-operatively. Her analgesics were optimized but pain persisted. She also had a slow drift of her Hgb by POD#2, with a Hgb of 8.0 from 10.1 By POD#3, patient's pain was improved and she was hopeful for discharge, but with worsening pain on ambulation in her right flank she opted for continued observation. Her abdominal and flank exam throughout was benign and notable only for some mild expected post-operative distension. Through POD#6, patient was still intermittently requiring IV narcotics for pain control. She was otherwise normalized and tolerating a regular diet with stable vital signs and return of bowel function. She ambulated occasionally but noted discomfort that restricted her from doing it more.  On POD#6, patient's Hgb was 6.6 and she was transfused 1u pRBC. Patient noted significant improvement in her pain and energy levels afterwards. By POD#7, her Hgb corrected to 7.8 and her pain was well-controlled on oral pain agents. She was ambulating without difficulty and was eager for discharge. She met all goals for discharge by POD#7 and was discharged without incident.  Laboratory values:  Recent Labs    11/27/22 1041  11/28/22 0757 11/29/22 0507  HGB 7.1* 6.6* 7.8*  HCT 23.7* 21.5* 25.3*   Recent Labs    11/28/22 0757 11/29/22 0507  CREATININE 1.50* 1.52*    Disposition: Home  Discharge instruction: The patient was instructed to be ambulatory but told to refrain from heavy lifting, strenuous activity, or driving.   Discharge medications:  Allergies as of 11/29/2022       Reactions   Nsaids Other (See Comments)   Told to avoid due to kidney function        Medication List     TAKE these medications    albuterol (2.5 MG/3ML) 0.083% nebulizer solution Commonly known as: PROVENTIL Take 2.5 mg by nebulization every 6 (six) hours as needed for wheezing or shortness of breath.   albuterol 108 (90 Base) MCG/ACT inhaler Commonly known as: VENTOLIN HFA Inhale 2 puffs into the lungs every 4 (four) hours as needed for wheezing or shortness of breath. For shortness of breath   ALPRAZolam 1 MG tablet Commonly known as: XANAX Take 1 mg by mouth 3 (three) times daily as needed for anxiety.   ASCORBIC ACID PO Take 1 tablet by mouth daily.   aspirin EC 81 MG tablet Take 81 mg by mouth daily.   buPROPion 300 MG 24 hr tablet Commonly known as: WELLBUTRIN XL Take 300 mg by mouth daily.   busPIRone 15 MG tablet Commonly known as: BUSPAR Take 1 tablet (15 mg total) by mouth 3 (three) times daily.   cyclobenzaprine 10 MG tablet Commonly known as: FLEXERIL Take 1 tablet (10 mg total) by mouth 2 (two) times daily as needed for muscle spasms. What changed: when to take this   docusate sodium 100 MG capsule Commonly known  as: COLACE Take 1 capsule (100 mg total) by mouth 2 (two) times daily.   famotidine 40 MG tablet Commonly known as: PEPCID Take 1 tablet (40 mg total) by mouth at bedtime.   fenofibrate 145 MG tablet Commonly known as: TRICOR Take 145 mg by mouth daily.   Fluticasone-Salmeterol 500-50 MCG/DOSE Aepb Commonly known as: ADVAIR Inhale 1 puff into the lungs 2 (two)  times daily.   gabapentin 600 MG tablet Commonly known as: NEURONTIN Take 0.5 tablets (300 mg total) by mouth 3 (three) times daily.   gemfibrozil 600 MG tablet Commonly known as: LOPID Take 1 tablet (600 mg total) by mouth 2 (two) times daily before a meal.   glimepiride 2 MG tablet Commonly known as: AMARYL Take 1 tablet (2 mg total) by mouth daily with breakfast. What changed: additional instructions   HYDROcodone-acetaminophen 5-325 MG tablet Commonly known as: Norco Take 1-2 tablets by mouth every 6 (six) hours as needed for moderate pain or severe pain.   levothyroxine 175 MCG tablet Commonly known as: SYNTHROID Take 175 mcg by mouth daily before breakfast.   lisinopril-hydrochlorothiazide 20-25 MG tablet Commonly known as: ZESTORETIC Take 1 tablet by mouth daily.   meclizine 25 MG tablet Commonly known as: ANTIVERT Take 1 tablet (25 mg total) by mouth 3 (three) times daily as needed for dizziness.   Multivitamin Gummies Womens [employer] 1 tablet by mouth daily.   omega-3 acid ethyl esters 1 g capsule Commonly known as: LOVAZA Take 2 g by mouth 2 (two) times daily.   OYSTER SHELL PO Take 1 tablet by mouth daily.   pantoprazole 40 MG tablet Commonly known as: PROTONIX Take 2 tablets (80 mg total) by mouth 2 (two) times daily. What changed:  how much to take when to take this reasons to take this   rosuvastatin 10 MG tablet Commonly known as: CRESTOR Take 10 mg by mouth at bedtime.   sucralfate 1 g tablet Commonly known as: CARAFATE Take 1 g by mouth 2 (two) times daily.   VITAMIN D-3 PO Take 1 capsule by mouth daily.   zafirlukast 20 MG tablet Commonly known as: ACCOLATE Take 20 mg by mouth daily.   ZINC PO Take 1 tablet by mouth daily.   zolpidem 10 MG tablet Commonly known as: AMBIEN Take 10 mg by mouth at bedtime as needed for sleep.        Followup:   Follow-up Information     Alexis Frock, MD Follow up on 12/07/2022.    Specialty: Urology Why: at 10:30 for MD visit and pathology review Contact information: [place] [address] [phone]         Alexis Frock, MD Follow up.   Specialty: Urology Why: As scheduled Contact information: [place] [address] [phone]         Care, [facility] Follow up.   Specialty: [place] Why: to provide home physical therapy visits Contact information: [address] [place] [address] [phone]

## 2022-11-29 NOTE — Progress Notes (Signed)
Patient ambulated around 250 feet in hallway with minimal assist and a RW. Tolerated well. Per pt, she has a walker at home. PRN Dulcolax suppository given and pt currently sitting up in chair, awaiting breakfast. Pt to call granddaughter and arrange transportation home. Will continue to follow-up.

## 2022-11-29 NOTE — Progress Notes (Signed)
Patient discharged with granddaughter/boyfriend. D/C instructions explained and pt verbalized understanding. All belongings sent with patient. Pt aware of meds to take at home and when/where to pick up medications.

## 2022-11-29 NOTE — TOC Transition Note (Signed)
Transition of Care [facility]) - CM/SW Discharge Note   Patient Details  Name: Chelsea Kerr MRN: [id number] Date of Birth: Feb 21, 1956  Transition of Care [facility]) CM/SW Contact:  Leeroy Cha, RN Phone Number: 11/29/2022, 7:57 AM   Clinical Narrative:    Patient dcd to home with no toc needs   Final next level of care: Home/Self Care Barriers to Discharge: Barriers Resolved   Patient Goals and CMS Choice      Discharge Placement                         Discharge Plan and Services Additional resources added to the After Visit Summary for                  DME Arranged: N/A DME Agency: NA       HH Arranged: PT HH Agency: [place] [facility] Agency Contacted: 11/26/22 Time [place]: 1414 Representative spoke with at [place]: [place] Determinants of Health ([place]) Interventions [place]: No Food Insecurity (11/23/2022)  Housing: Low Risk  (11/23/2022)  Transportation Needs: No Transportation Needs (11/23/2022)  Utilities: Not At Risk (11/23/2022)  Tobacco Use: Low Risk  (11/22/2022)     Readmission Risk Interventions   Row Labels 11/26/2022    2:13 PM  Readmission Risk Prevention Plan   Section Header. No data exists in this row.   Transportation Screening   Complete  PCP or Specialist Appt within 5-7 Days   Complete  Home Care Screening   Complete  Medication Review (RN CM)   Complete

## 2022-11-29 NOTE — Progress Notes (Signed)
Patient has had a large BM, has been sitting up in chair since this AM, and has ambulated to the BR several times. Pt stated granddaughter would provide transportation- now not sure who will provide transportation. Will continue to monitor and encourage D/C.

## 2022-11-29 NOTE — Care Management Important Message (Signed)
Important Message  Patient Details IM Letter given Name: Chelsea Kerr MRN: [id number] Date of Birth: 1956/03/18   Medicare Important Message Given:  Yes     Kerin Salen 11/29/2022, 11:33 AM

## 2022-12-01 LAB — BPAM RBC
Blood Product Expiration Date: 202401152359
ISSUE DATE / TIME: 202312261530
Unit Type and Rh: 7300

## 2022-12-01 LAB — TYPE AND SCREEN
ABO/RH(D): B POS
Antibody Screen: NEGATIVE
Unit division: 0

## 2022-12-18 DIAGNOSIS — C641 Malignant neoplasm of right kidney, except renal pelvis: Secondary | ICD-10-CM | POA: Diagnosis not present

## 2022-12-18 DIAGNOSIS — Z905 Acquired absence of kidney: Secondary | ICD-10-CM | POA: Diagnosis not present

## 2023-01-30 DIAGNOSIS — M545 Low back pain, unspecified: Secondary | ICD-10-CM | POA: Diagnosis not present

## 2023-02-02 DIAGNOSIS — M5416 Radiculopathy, lumbar region: Secondary | ICD-10-CM | POA: Diagnosis not present

## 2023-02-19 DIAGNOSIS — M5416 Radiculopathy, lumbar region: Secondary | ICD-10-CM | POA: Diagnosis not present

## 2023-02-27 DIAGNOSIS — M5416 Radiculopathy, lumbar region: Secondary | ICD-10-CM | POA: Diagnosis not present

## 2023-03-22 DIAGNOSIS — M5416 Radiculopathy, lumbar region: Secondary | ICD-10-CM | POA: Diagnosis not present

## 2023-05-17 DIAGNOSIS — Z8553 Personal history of malignant neoplasm of renal pelvis: Secondary | ICD-10-CM | POA: Insufficient documentation

## 2023-05-17 DIAGNOSIS — Z1321 Encounter for screening for nutritional disorder: Secondary | ICD-10-CM | POA: Diagnosis not present

## 2023-05-17 DIAGNOSIS — E559 Vitamin D deficiency, unspecified: Secondary | ICD-10-CM | POA: Diagnosis not present

## 2023-05-17 DIAGNOSIS — Z131 Encounter for screening for diabetes mellitus: Secondary | ICD-10-CM | POA: Diagnosis not present

## 2023-05-17 DIAGNOSIS — M5416 Radiculopathy, lumbar region: Secondary | ICD-10-CM | POA: Diagnosis not present

## 2023-05-17 DIAGNOSIS — Z13228 Encounter for screening for other metabolic disorders: Secondary | ICD-10-CM | POA: Diagnosis not present

## 2023-05-17 DIAGNOSIS — G8929 Other chronic pain: Secondary | ICD-10-CM | POA: Diagnosis not present

## 2023-05-17 DIAGNOSIS — E039 Hypothyroidism, unspecified: Secondary | ICD-10-CM | POA: Diagnosis not present

## 2023-05-17 DIAGNOSIS — I10 Essential (primary) hypertension: Secondary | ICD-10-CM | POA: Diagnosis not present

## 2023-05-17 DIAGNOSIS — D519 Vitamin B12 deficiency anemia, unspecified: Secondary | ICD-10-CM | POA: Diagnosis not present

## 2023-05-17 DIAGNOSIS — E785 Hyperlipidemia, unspecified: Secondary | ICD-10-CM | POA: Diagnosis not present

## 2023-05-23 DIAGNOSIS — M5451 Vertebrogenic low back pain: Secondary | ICD-10-CM | POA: Diagnosis not present

## 2023-05-25 ENCOUNTER — Emergency Department (HOSPITAL_COMMUNITY): Payer: 59

## 2023-05-25 ENCOUNTER — Other Ambulatory Visit: Payer: Self-pay

## 2023-05-25 ENCOUNTER — Emergency Department (HOSPITAL_COMMUNITY)
Admission: EM | Admit: 2023-05-25 | Discharge: 2023-05-26 | Disposition: A | Discharge status: Home / Self Care | Payer: 59 | Attending: Emergency Medicine | Admitting: Emergency Medicine

## 2023-05-25 DIAGNOSIS — M5021 Other cervical disc displacement,  high cervical region: Secondary | ICD-10-CM | POA: Diagnosis not present

## 2023-05-25 DIAGNOSIS — M50221 Other cervical disc displacement at C4-C5 level: Secondary | ICD-10-CM | POA: Diagnosis not present

## 2023-05-25 DIAGNOSIS — E785 Hyperlipidemia, unspecified: Secondary | ICD-10-CM | POA: Diagnosis not present

## 2023-05-25 DIAGNOSIS — I129 Hypertensive chronic kidney disease with stage 1 through stage 4 chronic kidney disease, or unspecified chronic kidney disease: Secondary | ICD-10-CM | POA: Diagnosis not present

## 2023-05-25 DIAGNOSIS — N189 Chronic kidney disease, unspecified: Secondary | ICD-10-CM | POA: Diagnosis not present

## 2023-05-25 DIAGNOSIS — R42 Dizziness and giddiness: Secondary | ICD-10-CM | POA: Diagnosis not present

## 2023-05-25 DIAGNOSIS — R29810 Facial weakness: Secondary | ICD-10-CM | POA: Insufficient documentation

## 2023-05-25 DIAGNOSIS — F32A Depression, unspecified: Secondary | ICD-10-CM | POA: Diagnosis not present

## 2023-05-25 DIAGNOSIS — M5031 Other cervical disc degeneration,  high cervical region: Secondary | ICD-10-CM | POA: Diagnosis not present

## 2023-05-25 DIAGNOSIS — R200 Anesthesia of skin: Secondary | ICD-10-CM

## 2023-05-25 DIAGNOSIS — R0602 Shortness of breath: Secondary | ICD-10-CM | POA: Diagnosis not present

## 2023-05-25 DIAGNOSIS — M6281 Muscle weakness (generalized): Secondary | ICD-10-CM | POA: Diagnosis not present

## 2023-05-25 DIAGNOSIS — R8290 Unspecified abnormal findings in urine: Secondary | ICD-10-CM | POA: Diagnosis not present

## 2023-05-25 DIAGNOSIS — R202 Paresthesia of skin: Secondary | ICD-10-CM | POA: Insufficient documentation

## 2023-05-25 DIAGNOSIS — R519 Headache, unspecified: Secondary | ICD-10-CM | POA: Diagnosis not present

## 2023-05-25 DIAGNOSIS — Z5181 Encounter for therapeutic drug level monitoring: Secondary | ICD-10-CM | POA: Diagnosis not present

## 2023-05-25 DIAGNOSIS — I6522 Occlusion and stenosis of left carotid artery: Secondary | ICD-10-CM | POA: Diagnosis not present

## 2023-05-25 DIAGNOSIS — R531 Weakness: Secondary | ICD-10-CM | POA: Insufficient documentation

## 2023-05-25 DIAGNOSIS — Z7982 Long term (current) use of aspirin: Secondary | ICD-10-CM | POA: Diagnosis not present

## 2023-05-25 DIAGNOSIS — Z79899 Other long term (current) drug therapy: Secondary | ICD-10-CM | POA: Diagnosis not present

## 2023-05-25 DIAGNOSIS — M4802 Spinal stenosis, cervical region: Secondary | ICD-10-CM | POA: Diagnosis not present

## 2023-05-25 DIAGNOSIS — M5137 Other intervertebral disc degeneration, lumbosacral region: Secondary | ICD-10-CM | POA: Diagnosis not present

## 2023-05-25 DIAGNOSIS — I10 Essential (primary) hypertension: Secondary | ICD-10-CM | POA: Diagnosis not present

## 2023-05-25 DIAGNOSIS — M48061 Spinal stenosis, lumbar region without neurogenic claudication: Secondary | ICD-10-CM | POA: Diagnosis not present

## 2023-05-25 DIAGNOSIS — M5416 Radiculopathy, lumbar region: Secondary | ICD-10-CM | POA: Diagnosis not present

## 2023-05-25 DIAGNOSIS — M4807 Spinal stenosis, lumbosacral region: Secondary | ICD-10-CM | POA: Diagnosis not present

## 2023-05-25 DIAGNOSIS — I6782 Cerebral ischemia: Secondary | ICD-10-CM | POA: Diagnosis not present

## 2023-05-25 DIAGNOSIS — M5136 Other intervertebral disc degeneration, lumbar region: Secondary | ICD-10-CM | POA: Diagnosis not present

## 2023-05-25 LAB — COMPREHENSIVE METABOLIC PANEL
ALT: 13 U/L (ref 0–44)
AST: 13 U/L — ABNORMAL LOW (ref 15–41)
Albumin: 4.1 g/dL (ref 3.5–5.0)
Alkaline Phosphatase: 53 U/L (ref 38–126)
Anion gap: 8 (ref 5–15)
BUN: 25 mg/dL — ABNORMAL HIGH (ref 8–23)
CO2: 20 mmol/L — ABNORMAL LOW (ref 22–32)
Calcium: 9.6 mg/dL (ref 8.9–10.3)
Chloride: 109 mmol/L (ref 98–111)
Creatinine, Ser: 1.51 mg/dL — ABNORMAL HIGH (ref 0.44–1.00)
GFR, Estimated: 38 mL/min — ABNORMAL LOW (ref >60)
Glucose, Bld: 137 mg/dL — ABNORMAL HIGH (ref 70–99)
Potassium: 4 mmol/L (ref 3.5–5.1)
Sodium: 137 mmol/L (ref 135–145)
Total Bilirubin: 0.6 mg/dL (ref 0.3–1.2)
Total Protein: 7.7 g/dL (ref 6.5–8.1)

## 2023-05-25 LAB — DIFFERENTIAL
Abs Immature Granulocytes: 0.03 10*3/uL (ref 0.00–0.07)
Basophils Absolute: 0 10*3/uL (ref 0.0–0.1)
Basophils Relative: 1 %
Eosinophils Absolute: 0.1 10*3/uL (ref 0.0–0.5)
Eosinophils Relative: 1 %
Immature Granulocytes: 1 %
Lymphocytes Relative: 28 %
Lymphs Abs: 1.8 10*3/uL (ref 0.7–4.0)
Monocytes Absolute: 0.4 10*3/uL (ref 0.1–1.0)
Monocytes Relative: 6 %
Neutro Abs: 4.2 10*3/uL (ref 1.7–7.7)
Neutrophils Relative %: 63 %

## 2023-05-25 LAB — CBC
HCT: 37 % (ref 36.0–46.0)
Hemoglobin: 11.8 g/dL — ABNORMAL LOW (ref 12.0–15.0)
MCH: 32.2 pg (ref 26.0–34.0)
MCHC: 31.9 g/dL (ref 30.0–36.0)
MCV: 100.8 fL — ABNORMAL HIGH (ref 80.0–100.0)
Platelets: 325 10*3/uL (ref 150–400)
RBC: 3.67 MIL/uL — ABNORMAL LOW (ref 3.87–5.11)
RDW: 13 % (ref 11.5–15.5)
WBC: 6.5 10*3/uL (ref 4.0–10.5)
nRBC: 0 % (ref 0.0–0.2)

## 2023-05-25 MED ORDER — GADOBUTROL 1 MMOL/ML IV SOLN
7.5000 mL | Freq: Once | INTRAVENOUS | Status: AC | PRN
  Administered 2023-05-25: 7.5 mL via INTRAVENOUS

## 2023-05-25 MED ORDER — OXYCODONE-ACETAMINOPHEN 5-325 MG PO TABS
1.0000 | ORAL_TABLET | Freq: Once | ORAL | Status: AC
  Administered 2023-05-25: 1 via ORAL
  Filled 2023-05-25: qty 1

## 2023-05-25 NOTE — ED Provider Triage Note (Signed)
Emergency Medicine Provider Triage Evaluation Note  Chelsea Kerr , a 67 y.o. female with self-reported history of kidney cancer was evaluated in triage.  Pt complains of 7 days of left facial droop, decreased sensation, and left upper extremity numbness and weakness.  No history of stroke.  Unsure if her kidney cancer has metastasized.  Review of Systems  Positive: Left upper extremity and left facial droop and numbness Negative: Severe headache  Physical Exam  BP (!) 115/54 (BP Location: Right Arm)   Pulse 84   Temp 97.7 F (36.5 C) (Oral)   Resp 16   Ht 5\' 2"  (1.575 m)   Wt 79 kg   SpO2 95%   BMI 31.85 kg/m  Gen:   Awake, no distress  Resp:  Normal effort  MSK:   Mild left-sided facial droop and decreased sensation to light touch with mild weakness of the left upper and left lower extremity with decreased sensation to light touch  Medical Decision Making  Medically screening exam initiated at 6:28 PM.  Appropriate orders placed.  Chelsea Kerr was informed that the remainder of the evaluation will be completed by another provider, this initial triage assessment does not replace that evaluation, and the importance of remaining in the ED until their evaluation is complete.  Patient outside of window for code stroke activation and feel that she likely either has a stroke or potentially even a brain metastasis given her cancer history.    Rondel Baton, MD 05/25/23 [phone]

## 2023-05-25 NOTE — ED Provider Notes (Signed)
[facility]PARTMENT AT [facility] Provider Note   CSN: [id number] Arrival date & time: 05/25/23  1810     History {Add pertinent medical, surgical, social history, OB history to HPI:1} Chief Complaint  Patient presents with   Facial Droop   Extremity Weakness    Chelsea Kerr is a 67 y.o. female.  67 year old female with a history of kidney stance area status post nephrectomy in December 2023, HTN, HLD, CKD, and anxiety who presents to the emergency department with left hemibody weakness and numbness.  Symptoms started approximately 1 month ago with left foot numbness and weakness.  Then started to feel that her left arm was becoming numb and weak.  Said that approximately 1 week ago started noticing that the left side of her face was also numb and weak and she was drooling from the left side of her mouth at night.  Has been having severe headaches for the past 4 weeks on the left side of her head.  Also having occasional nausea and vomiting.  Says she has had bilateral blurry vision occasionally along with palpitations.  Does have a history of chronic lower back pain but reports that is more severe than usual in the past few weeks and is also having severe neck pain.  Not on blood thinners does not have a history of stroke.  Unknown if she has any metastasis from her kidney cancer and says that she is undergoing staging evaluation at this time.       Home Medications Prior to Admission medications   Medication Sig Start Date End Date Taking? Authorizing Provider  albuterol (PROVENTIL HFA;VENTOLIN HFA) 108 (90 BASE) MCG/ACT inhaler Inhale 2 puffs into the lungs every 4 (four) hours as needed for wheezing or shortness of breath. For shortness of breath   Yes [provider]  albuterol (PROVENTIL) (2.5 MG/3ML) 0.083% nebulizer solution Take 2.5 mg by nebulization every 6 (six) hours as needed for wheezing or shortness of breath.   Yes [provider]  ALPRAZolam Prudy Feeler) 0.5 MG tablet Take 0.5 mg by mouth 2 (two) times daily as needed for anxiety or sleep.   Yes [provider]  aspirin EC 81 MG tablet Take 81 mg by mouth at bedtime.   Yes [provider]  buPROPion (WELLBUTRIN XL) 150 MG 24 hr tablet Take 300 mg by mouth daily.   Yes [provider]  busPIRone (BUSPAR) 15 MG tablet Take 1 tablet (15 mg total) by mouth 3 (three) times daily. 12/17/20  Yes Joseph Art, DO  Calcium Carb-Cholecalciferol (CALCIUM + D3 PO) Take 1 tablet by mouth daily with breakfast.   Yes [provider]  cyclobenzaprine (FLEXERIL) 5 MG tablet Take 5 mg by mouth in the morning.   Yes [provider]  famotidine (PEPCID) 40 MG tablet Take 1 tablet (40 mg total) by mouth at bedtime. Patient taking differently: Take 40 mg by mouth in the morning. 12/17/20  Yes Vann, Jessica U, DO  Fluticasone-Salmeterol (ADVAIR) 500-50 MCG/DOSE AEPB Inhale 1 puff into the lungs 2 (two) times daily.   Yes [provider]  gabapentin (NEURONTIN) 300 MG capsule Take 300 mg by mouth 3 (three) times daily.   Yes [provider]  levothyroxine (SYNTHROID) 150 MCG tablet Take 150 mcg by mouth daily before breakfast.   Yes [provider]  lisinopril (ZESTRIL) 10 MG tablet Take 10 mg by mouth daily.   Yes [provider]  omega-3 acid  ethyl esters (LOVAZA) 1 g capsule Take 2 g by mouth 2 (two) times daily. 07/06/22  Yes [provider]  omeprazole (PRILOSEC) 20 MG capsule Take 20 mg by mouth 2 (two) times daily before a meal.   Yes [provider]  TYLENOL 500 MG tablet Take 1,000 mg by mouth every 6 (six) hours as needed for mild pain or headache.   Yes [provider]  zolpidem (AMBIEN) 10 MG tablet Take 10 mg by mouth at bedtime as needed for sleep.   Yes [provider]  cyclobenzaprine (FLEXERIL) 10 MG tablet Take 1 tablet (10 mg total) by mouth 2 (two) times daily as  needed for muscle spasms. Patient not taking: Reported on 05/25/2023 09/11/22   Gareth Eagle, PA-C  docusate sodium (COLACE) 100 MG capsule Take 1 capsule (100 mg total) by mouth 2 (two) times daily. Patient not taking: Reported on 05/25/2023 11/22/22   Harrie Foreman, PA-C  gemfibrozil (LOPID) 600 MG tablet Take 1 tablet (600 mg total) by mouth 2 (two) times daily before a meal. Patient not taking: Reported on 05/25/2023 06/22/20   Rhetta Mura, MD  glimepiride (AMARYL) 2 MG tablet Take 1 tablet (2 mg total) by mouth daily with breakfast. Patient not taking: Reported on 05/25/2023 06/23/20   Rhetta Mura, MD  HYDROcodone-acetaminophen (NORCO) 5-325 MG tablet Take 1-2 tablets by mouth every 6 (six) hours as needed for moderate pain or severe pain. Patient not taking: Reported on 05/25/2023 11/22/22   Harrie Foreman, PA-C  meclizine (ANTIVERT) 25 MG tablet Take 1 tablet (25 mg total) by mouth 3 (three) times daily as needed for dizziness. Patient not taking: Reported on 05/25/2023 12/17/20   Joseph Art, DO  pantoprazole (PROTONIX) 40 MG tablet Take 2 tablets (80 mg total) by mouth 2 (two) times daily. Patient not taking: Reported on 05/25/2023 12/17/20   Joseph Art, DO  zafirlukast (ACCOLATE) 20 MG tablet Take 20 mg by mouth daily. Patient not taking: Reported on 05/25/2023 08/24/20   [provider]      Allergies    Patient has no known allergies.    Review of Systems   Review of Systems  Physical Exam Updated Vital Signs BP (!) 115/54 (BP Location: Right Arm)   Pulse 84   Temp 97.7 F (36.5 C) (Oral)   Resp 16   Ht 5\' 2"  (1.575 m)   Wt 79 kg   SpO2 95%   BMI 31.85 kg/m  Physical Exam Vitals and nursing note reviewed.  Constitutional:      General: She is not in acute distress.    Appearance: She is well-developed.  HENT:     Head: Normocephalic and atraumatic.     Right Ear: External ear normal.     Left Ear: External ear normal.     Nose: Nose  normal.  Eyes:     Extraocular Movements: Extraocular movements intact.     Conjunctiva/sclera: Conjunctivae normal.     Pupils: Pupils are equal, round, and reactive to light.  Cardiovascular:     Rate and Rhythm: Normal rate and regular rhythm.     Heart sounds: No murmur heard. Pulmonary:     Effort: Pulmonary effort is normal. No respiratory distress.     Breath sounds: Normal breath sounds.  Abdominal:     General: Abdomen is flat. There is no distension.     Palpations: Abdomen is soft. There is no mass.     Tenderness: There is no  abdominal tenderness. There is no guarding.  Musculoskeletal:     Cervical back: Normal range of motion and neck supple.     Right lower leg: No edema.     Left lower leg: No edema.  Skin:    General: Skin is warm and dry.  Neurological:     Mental Status: She is alert and oriented to person, place, and time.     Comments: MENTAL STATUS: AAOx3 CRANIAL NERVES: II: Pupils equal and reactive 3 mm BL, no RAPD, no VF deficits III, IV, VI: EOM intact, no gaze preference or deviation, no nystagmus. V: Diminished sensation to light touch in left V1, V2, and V3 segments bilaterally VII: Nasolabial fold flattening on the left side VIII: normal hearing to speech and finger friction IX, X: normal palatal elevation, no uvular deviation XI: 5/5 head turn and 5/5 shoulder shrug bilaterally XII: midline tongue protrusion MOTOR: 5/5 strength in R shoulder flexion, elbow flexion and extension, and grip strength. 4/5 strength in L shoulder flexion, elbow flexion and extension, and grip strength.  5/5 strength in R hip and knee flexion, knee extension, ankle plantar and dorsiflexion. 4/5 strength in L hip and knee flexion, knee extension, ankle plantar and dorsiflexion. SENSORY: Diminished sensation to light touch in left arm and left leg COORD: Normal finger to nose and heel to shin, no tremor, no dysmetria STATION: no truncal ataxia   Psychiatric:        Mood  and Affect: Mood normal.     ED Results / Procedures / Treatments   Labs (all labs ordered are listed, but only abnormal results are displayed) Labs Reviewed  CBC - Abnormal; Notable for the following components:      Result Value   RBC 3.67 (*)    Hemoglobin 11.8 (*)    MCV 100.8 (*)    All other components within normal limits  COMPREHENSIVE METABOLIC PANEL - Abnormal; Notable for the following components:   CO2 20 (*)    Glucose, Bld 137 (*)    BUN 25 (*)    Creatinine, Ser 1.51 (*)    AST 13 (*)    GFR, Estimated 38 (*)    All other components within normal limits  DIFFERENTIAL    EKG EKG Interpretation  Date/Time:  Friday May 25 2023 18:59:31 EDT Ventricular Rate:  89 PR Interval:  147 QRS Duration: 91 QT Interval:  361 QTC Calculation: 432 R Axis:   50 Text Interpretation: Sinus rhythm Ventricular premature complex Low voltage, precordial leads Confirmed by Vonita Moss [phone]) on 05/25/2023 8:24:58 PM  Radiology CT HEAD WO CONTRAST  Result Date: 05/25/2023 CLINICAL DATA:  Left-sided facial droop and numbness EXAM: CT HEAD WITHOUT CONTRAST TECHNIQUE: Contiguous axial images were obtained from the base of the skull through the vertex without intravenous contrast. RADIATION DOSE REDUCTION: This exam was performed according to the departmental dose-optimization program which includes automated exposure control, adjustment of the mA and/or kV according to patient size and/or use of iterative reconstruction technique. COMPARISON:  None Available. FINDINGS: Brain: No acute infarct or hemorrhage. Lateral ventricles and midline structures are unremarkable. No acute extra-axial fluid collections. No mass effect. Vascular: No hyperdense vessel or unexpected calcification. Skull: Normal. Negative for fracture or focal lesion. Sinuses/Orbits: No acute finding. Other: None. IMPRESSION: 1. No acute intracranial process. Electronically Signed   By: Sharlet Salina M.D.   On:  05/25/2023 18:55   DG Chest Portable 1 View  Result Date: 05/25/2023 CLINICAL DATA:  Left-sided facial  droop, left leg weakness EXAM: PORTABLE CHEST 1 VIEW COMPARISON:  07/10/2022 FINDINGS: The heart size and mediastinal contours are within normal limits. Both lungs are clear. The visualized skeletal structures are unremarkable. IMPRESSION: No active disease. Electronically Signed   By: Sharlet Salina M.D.   On: 05/25/2023 18:45    Procedures Procedures  {Document cardiac monitor, telemetry assessment procedure when appropriate:1}  Medications Ordered in ED Medications  oxyCODONE-acetaminophen (PERCOCET/ROXICET) 5-325 MG per tablet 1 tablet (1 tablet Oral Given 05/25/23 1956)    ED Course/ Medical Decision Making/ A&P Clinical Course as of 05/25/23 2025  Fri May 25, 2023  2024 Creatinine(!): 1.51 At baseline [RP]    Clinical Course User Index [RP] Rondel Baton, MD   {   Click here for ABCD2, HEART and other calculatorsREFRESH Note before signing :1}                          Medical Decision Making Amount and/or Complexity of Data Reviewed Labs: ordered. Decision-making details documented in ED Course. Radiology: ordered.  Risk Prescription drug management.   ***  {Document critical care time when appropriate:1} {Document review of labs and clinical decision tools ie heart score, Chads2Vasc2 etc:1}  {Document your independent review of radiology images, and any outside records:1} {Document your discussion with family members, caretakers, and with consultants:1} {Document social determinants of health affecting pt's care:1} {Document your decision making why or why not admission, treatments were needed:1} Final Clinical Impression(s) / ED Diagnoses Final diagnoses:  None    Rx / DC Orders ED Discharge Orders     None

## 2023-05-25 NOTE — ED Triage Notes (Signed)
Pt arrived via POV. Pt c/o L sided facial droop, L sided leg weakness for 1x week.  Pt has some L sided facial sensation loss, and L sided facial droop.

## 2023-05-26 ENCOUNTER — Emergency Department (HOSPITAL_COMMUNITY): Payer: 59

## 2023-05-26 MED ORDER — IOHEXOL 350 MG/ML SOLN
75.0000 mL | Freq: Once | INTRAVENOUS | Status: AC | PRN
  Administered 2023-05-26: 75 mL via INTRAVENOUS

## 2023-05-26 NOTE — Discharge Instructions (Signed)
You were seen for your headache, weakness, and numbness in the emergency department.   At home, please take tylenol for your headache.    Check your MyChart online for the results of any tests that had not resulted by the time you left the emergency department.   Follow-up with your primary doctor in 2-3 days regarding your visit.  Follow-up with the neurologists in 1 week.   Return immediately to the emergency department if you experience any of the following: worsening numbness or weakness, or any other concerning symptoms.    Thank you for visiting our Emergency Department. It was a pleasure taking care of you today.

## 2023-05-26 NOTE — ED Notes (Signed)
Discharge papers reviewed with pt, pt ambulatory from ED 

## 2023-05-26 NOTE — ED Provider Notes (Signed)
Received patient in turnover from Dr. Jarold Motto.  Please see their note for further details of Hx, PE.  Briefly patient is a 67 y.o. female with a Facial Droop and Extremity Weakness .  67 yo F with with ongoing progressive left-sided weakness.  She had multiple MRI images without obvious etiology.  Case discussed with neuro. Plan for CT angiogram head and neck if negative plan to follow-up with outpatient neurology.  CT angiogram has some abnormal findings.  I discussed this with Dr.Khaliqdina, he did not think this was consistent with her symptoms and feels most appropriate for outpatient follow-up.    Melene Plan, DO 05/26/23 [phone]

## 2023-05-29 ENCOUNTER — Ambulatory Visit (INDEPENDENT_AMBULATORY_CARE_PROVIDER_SITE_OTHER): Payer: 59 | Admitting: Neurology

## 2023-05-29 ENCOUNTER — Encounter: Payer: Self-pay | Admitting: Neurology

## 2023-05-29 VITALS — BP 113/68 | HR 100 | Ht 62.0 in | Wt 162.0 lb

## 2023-05-29 DIAGNOSIS — M47812 Spondylosis without myelopathy or radiculopathy, cervical region: Secondary | ICD-10-CM | POA: Diagnosis not present

## 2023-05-29 DIAGNOSIS — R202 Paresthesia of skin: Secondary | ICD-10-CM | POA: Diagnosis not present

## 2023-05-29 HISTORY — DX: Spondylosis without myelopathy or radiculopathy, cervical region: M47.812

## 2023-05-29 HISTORY — DX: Paresthesia of skin: R20.2

## 2023-05-29 MED ORDER — DULOXETINE HCL 30 MG PO CPEP: 30.0000 mg | ORAL_CAPSULE | Freq: Every day | ORAL | 5 refills | Status: DC
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
## 2023-05-30 ENCOUNTER — Telehealth: Payer: Self-pay | Admitting: Neurology

## 2023-05-30 DIAGNOSIS — Z09 Encounter for follow-up examination after completed treatment for conditions other than malignant neoplasm: Secondary | ICD-10-CM | POA: Insufficient documentation

## 2023-05-30 DIAGNOSIS — R8290 Unspecified abnormal findings in urine: Secondary | ICD-10-CM | POA: Diagnosis not present

## 2023-05-30 DIAGNOSIS — Z5181 Encounter for therapeutic drug level monitoring: Secondary | ICD-10-CM | POA: Diagnosis not present

## 2023-05-30 DIAGNOSIS — I10 Essential (primary) hypertension: Secondary | ICD-10-CM | POA: Diagnosis not present

## 2023-05-30 DIAGNOSIS — G8929 Other chronic pain: Secondary | ICD-10-CM | POA: Diagnosis not present

## 2023-05-30 DIAGNOSIS — M5416 Radiculopathy, lumbar region: Secondary | ICD-10-CM | POA: Diagnosis not present

## 2023-05-30 DIAGNOSIS — M50122 Cervical disc disorder at C5-C6 level with radiculopathy: Secondary | ICD-10-CM | POA: Diagnosis not present

## 2023-05-30 NOTE — Telephone Encounter (Signed)
Referral faxed to [facility]: Phone: [phone]  Fax:[phone] 

## 2023-06-01 ENCOUNTER — Telehealth: Payer: Self-pay

## 2023-06-01 NOTE — Telephone Encounter (Signed)
Transition Care Management Unsuccessful Follow-up Telephone Call  Date of discharge and from where:  [facility] 6/22  Attempts:  1st Attempt  Reason for unsuccessful TCM follow-up call:  Left voice message   Lenard Forth [facility] Guide, [facility] [phone] [address] [place], [place], [place] [address] Phone: [phone] Email: [email or web address]

## 2023-06-04 ENCOUNTER — Telehealth: Payer: Self-pay

## 2023-06-04 NOTE — Telephone Encounter (Signed)
Transition Care Management Unsuccessful Follow-up Telephone Call  Date of discharge and from where:  [facility] 6/22  Attempts:  2nd  Reason for unsuccessful TCM follow-up call:  Left voice message   Lenard Forth [facility] Guide, [facility] [phone] [address] [place], [place], [place] [address] Phone: [phone] Email: [email or web address]

## 2023-06-28 DIAGNOSIS — M5416 Radiculopathy, lumbar region: Secondary | ICD-10-CM | POA: Diagnosis not present

## 2023-06-28 DIAGNOSIS — Z5181 Encounter for therapeutic drug level monitoring: Secondary | ICD-10-CM | POA: Diagnosis not present

## 2023-06-28 DIAGNOSIS — G8929 Other chronic pain: Secondary | ICD-10-CM | POA: Diagnosis not present

## 2023-06-28 DIAGNOSIS — M50122 Cervical disc disorder at C5-C6 level with radiculopathy: Secondary | ICD-10-CM | POA: Diagnosis not present

## 2023-06-28 DIAGNOSIS — Z79899 Other long term (current) drug therapy: Secondary | ICD-10-CM | POA: Diagnosis not present

## 2023-06-28 DIAGNOSIS — E785 Hyperlipidemia, unspecified: Secondary | ICD-10-CM | POA: Diagnosis not present

## 2023-06-28 DIAGNOSIS — Z23 Encounter for immunization: Secondary | ICD-10-CM | POA: Diagnosis not present

## 2023-07-10 ENCOUNTER — Ambulatory Visit (HOSPITAL_COMMUNITY)
Admission: RE | Admit: 2023-07-10 | Discharge: 2023-07-10 | Disposition: A | Discharge status: Home / Self Care | Payer: 59 | Source: Ambulatory Visit | Attending: Urology | Admitting: Urology

## 2023-07-10 ENCOUNTER — Other Ambulatory Visit (HOSPITAL_COMMUNITY): Payer: Self-pay | Admitting: Urology

## 2023-07-10 DIAGNOSIS — C641 Malignant neoplasm of right kidney, except renal pelvis: Secondary | ICD-10-CM | POA: Diagnosis not present

## 2023-07-12 DIAGNOSIS — Z905 Acquired absence of kidney: Secondary | ICD-10-CM | POA: Diagnosis not present

## 2023-07-12 DIAGNOSIS — K5730 Diverticulosis of large intestine without perforation or abscess without bleeding: Secondary | ICD-10-CM | POA: Diagnosis not present

## 2023-07-12 DIAGNOSIS — C641 Malignant neoplasm of right kidney, except renal pelvis: Secondary | ICD-10-CM | POA: Diagnosis not present

## 2023-07-17 DIAGNOSIS — Z905 Acquired absence of kidney: Secondary | ICD-10-CM | POA: Diagnosis not present

## 2023-07-17 DIAGNOSIS — C641 Malignant neoplasm of right kidney, except renal pelvis: Secondary | ICD-10-CM | POA: Diagnosis not present

## 2023-07-18 ENCOUNTER — Encounter: Payer: 59 | Admitting: Neurology

## 2023-10-02 ENCOUNTER — Encounter: Payer: Self-pay | Admitting: Internal Medicine

## 2023-10-31 DIAGNOSIS — I129 Hypertensive chronic kidney disease with stage 1 through stage 4 chronic kidney disease, or unspecified chronic kidney disease: Secondary | ICD-10-CM | POA: Diagnosis not present

## 2023-10-31 DIAGNOSIS — N390 Urinary tract infection, site not specified: Secondary | ICD-10-CM | POA: Diagnosis not present

## 2023-10-31 DIAGNOSIS — D631 Anemia in chronic kidney disease: Secondary | ICD-10-CM | POA: Diagnosis not present

## 2023-10-31 DIAGNOSIS — N189 Chronic kidney disease, unspecified: Secondary | ICD-10-CM | POA: Diagnosis not present

## 2023-10-31 DIAGNOSIS — N1832 Chronic kidney disease, stage 3b: Secondary | ICD-10-CM | POA: Diagnosis not present

## 2023-10-31 DIAGNOSIS — N2581 Secondary hyperparathyroidism of renal origin: Secondary | ICD-10-CM | POA: Diagnosis not present

## 2023-11-19 DIAGNOSIS — R0602 Shortness of breath: Secondary | ICD-10-CM | POA: Diagnosis not present

## 2023-11-19 DIAGNOSIS — M5010 Cervical disc disorder with radiculopathy, unspecified cervical region: Secondary | ICD-10-CM | POA: Insufficient documentation

## 2023-11-19 DIAGNOSIS — Z79899 Other long term (current) drug therapy: Secondary | ICD-10-CM | POA: Insufficient documentation

## 2023-11-19 DIAGNOSIS — Z23 Encounter for immunization: Secondary | ICD-10-CM | POA: Diagnosis not present

## 2023-11-19 DIAGNOSIS — Z5181 Encounter for therapeutic drug level monitoring: Secondary | ICD-10-CM | POA: Diagnosis not present

## 2023-11-19 DIAGNOSIS — R059 Cough, unspecified: Secondary | ICD-10-CM | POA: Diagnosis not present

## 2023-11-19 DIAGNOSIS — E785 Hyperlipidemia, unspecified: Secondary | ICD-10-CM | POA: Diagnosis not present

## 2023-11-19 DIAGNOSIS — R42 Dizziness and giddiness: Secondary | ICD-10-CM | POA: Insufficient documentation

## 2023-11-19 DIAGNOSIS — Z112 Encounter for screening for other bacterial diseases: Secondary | ICD-10-CM | POA: Diagnosis not present

## 2023-11-19 DIAGNOSIS — N1832 Chronic kidney disease, stage 3b: Secondary | ICD-10-CM | POA: Diagnosis not present

## 2023-11-19 DIAGNOSIS — M25511 Pain in right shoulder: Secondary | ICD-10-CM | POA: Insufficient documentation

## 2023-11-19 DIAGNOSIS — E039 Hypothyroidism, unspecified: Secondary | ICD-10-CM | POA: Diagnosis not present

## 2023-11-19 DIAGNOSIS — M50122 Cervical disc disorder at C5-C6 level with radiculopathy: Secondary | ICD-10-CM | POA: Diagnosis not present

## 2023-11-27 DIAGNOSIS — I129 Hypertensive chronic kidney disease with stage 1 through stage 4 chronic kidney disease, or unspecified chronic kidney disease: Secondary | ICD-10-CM | POA: Diagnosis not present

## 2023-11-27 DIAGNOSIS — N189 Chronic kidney disease, unspecified: Secondary | ICD-10-CM | POA: Diagnosis not present

## 2023-11-27 DIAGNOSIS — N2581 Secondary hyperparathyroidism of renal origin: Secondary | ICD-10-CM | POA: Diagnosis not present

## 2023-11-27 DIAGNOSIS — D631 Anemia in chronic kidney disease: Secondary | ICD-10-CM | POA: Diagnosis not present

## 2023-11-27 DIAGNOSIS — N1832 Chronic kidney disease, stage 3b: Secondary | ICD-10-CM | POA: Diagnosis not present

## 2023-12-21 DIAGNOSIS — Z112 Encounter for screening for other bacterial diseases: Secondary | ICD-10-CM | POA: Diagnosis not present

## 2023-12-21 DIAGNOSIS — H9202 Otalgia, left ear: Secondary | ICD-10-CM | POA: Insufficient documentation

## 2023-12-21 DIAGNOSIS — J019 Acute sinusitis, unspecified: Secondary | ICD-10-CM | POA: Insufficient documentation

## 2023-12-21 DIAGNOSIS — R0602 Shortness of breath: Secondary | ICD-10-CM | POA: Diagnosis not present

## 2023-12-21 DIAGNOSIS — R070 Pain in throat: Secondary | ICD-10-CM | POA: Diagnosis not present

## 2023-12-21 DIAGNOSIS — R051 Acute cough: Secondary | ICD-10-CM | POA: Diagnosis not present

## 2023-12-21 DIAGNOSIS — R062 Wheezing: Secondary | ICD-10-CM | POA: Diagnosis not present

## 2023-12-21 DIAGNOSIS — R519 Headache, unspecified: Secondary | ICD-10-CM | POA: Diagnosis not present

## 2024-01-03 DIAGNOSIS — Z20828 Contact with and (suspected) exposure to other viral communicable diseases: Secondary | ICD-10-CM | POA: Insufficient documentation

## 2024-01-03 DIAGNOSIS — R051 Acute cough: Secondary | ICD-10-CM | POA: Insufficient documentation

## 2024-01-03 DIAGNOSIS — J019 Acute sinusitis, unspecified: Secondary | ICD-10-CM | POA: Diagnosis not present

## 2024-01-03 DIAGNOSIS — R8290 Unspecified abnormal findings in urine: Secondary | ICD-10-CM | POA: Insufficient documentation

## 2024-01-03 DIAGNOSIS — R7309 Other abnormal glucose: Secondary | ICD-10-CM | POA: Insufficient documentation

## 2024-01-03 DIAGNOSIS — R062 Wheezing: Secondary | ICD-10-CM | POA: Insufficient documentation

## 2024-01-03 DIAGNOSIS — R0602 Shortness of breath: Secondary | ICD-10-CM | POA: Diagnosis not present

## 2024-01-03 DIAGNOSIS — R6883 Chills (without fever): Secondary | ICD-10-CM | POA: Insufficient documentation

## 2024-01-03 DIAGNOSIS — Z112 Encounter for screening for other bacterial diseases: Secondary | ICD-10-CM | POA: Diagnosis not present

## 2024-01-03 DIAGNOSIS — R070 Pain in throat: Secondary | ICD-10-CM | POA: Insufficient documentation

## 2024-01-03 DIAGNOSIS — Z1159 Encounter for screening for other viral diseases: Secondary | ICD-10-CM | POA: Diagnosis not present

## 2024-01-06 NOTE — Progress Notes (Deleted)
 Cardiology Office Note:   Date:  01/06/2024  ID:  Chelsea Kerr, DOB Aug 04, 1956, MRN [id number] PCP:  Chelsea Kerr FALCON, NP  [facility] Providers Cardiologist:  Wendel Haws, MD Referring MD: Chelsea Kerr FALCON, NP  Chief Complaint/Reason for Referral: Cardiology follow-up ASSESSMENT:    1. Type 2 diabetes mellitus with complication, without long-term current use of insulin  (HCC)   2. Hyperlipidemia associated with type 2 diabetes mellitus (HCC)   3. Aortic atherosclerosis (HCC)   4. Hypertension associated with diabetes (HCC)   5. Stage 3b chronic kidney disease (HCC)   6. BMI 34.0-34.9,adult     PLAN:   In order of problems listed above: Type 2 diabetes mellitus: Continue aspirin, continue lisinopril, start Crestor  20 mg daily, start Jardiance 10 mg daily.*** Hyperlipidemia: Start Crestor  20 mg and check lipid panel, LFTs, lipid in 2 months.*** Aortic atherosclerosis: Aspirin and start statin as above. Hypertension: *** CKD stage IIIb: Start Jardiance 10 mg daily and continue lisinopril for renal protection in a diabetic.*** Elevated BMI: Refer to pharmacy for GLP-1 receptor agonist therapy to reduce patient's risk of future myocardial infarction.***       {Are you ordering a CV Procedure (e.g. stress test, cath, DCCV, TEE, etc)?   Press F2        :[id number]}   Dispo:  No follow-ups on file.      Medication Adjustments/Labs and Tests Ordered: Current medicines are reviewed at length with the patient today.  Concerns regarding medicines are outlined above.  The following changes have been made:  {PLAN; NO CHANGE:13088:s}   Labs/tests ordered: No orders of the defined types were placed in this encounter.   Medication Changes: No orders of the defined types were placed in this encounter.   Current medicines are reviewed at length with the patient today.  The patient {ACTIONS; HAS/DOES NOT HAVE:19233} concerns regarding medicines.  I spent *** minutes reviewing all  clinical data during and prior to this visit including all relevant imaging studies, laboratories, clinical information from other health systems and prior notes from both Cardiology and other specialties, interviewing the patient, conducting a complete physical examination, and coordinating care in order to formulate a comprehensive and personalized evaluation and treatment plan.   History of Present Illness:      FOCUSED PROBLEM LIST:   Type 2 diabetes mellitus Not on insulin  COPD/asthma Hyperlipidemia Aortic atherosclerosis Chest CT 2022 Hypertension BMI 34 Hypothyroidism Chronic pain CKD stage IIIb  9/23:  The patient is a 68 y.o. female with the indicated medical history here for preoperative cardiovascular examination.  The patient was found to have a renal mass and is to undergo a nephrectomy.  She was seen recently by her care team and was noted to be dyspneic on exertion as well.  She tells me she had an episode where she felt lightheaded and had palpitations.  This was associated with some chest discomfort.  On a regular basis she does not get the symptoms.  She does sleep with multiple pillows for comfort and not so much for breathing difficulties.  She has noticed some unilateral leg swelling of her right lower extremity.  She has had no severe bleeding or bruising.  She fortunately is not required any emergency room visits or hospitalizations.  She denies any significant issues with her medications.  Plan: Obtain echocardiogram, nuclear stress test, and monitor.  2/25: In the interim the patient had an echocardiogram and nuclear stress test which were reassuring.  The monitor also demonstrated  rare PACs and PVCs.  Seen today due to recommendations regarding blood pressure management and lipid panel drawn by her primary care provider which demonstrated triglycerides of 314 and LDL could not be assessed.          Current Medications: No outpatient medications have been marked as  taking for the 01/10/24 encounter (Appointment) with Lunetta Marina K, MD.     Review of Systems:   Please see the history of present illness.    All other systems reviewed and are negative.     EKGs/Labs/Other Test Reviewed:   EKG: EKG from June 2024 that I personally reviewed demonstrates sinus rhythm with PVCs  EKG Interpretation Date/Time:    Ventricular Rate:    PR Interval:    QRS Duration:    QT Interval:    QTC Calculation:   R Axis:      Text Interpretation:           Risk Assessment/Calculations:   {Does this patient have ATRIAL FIBRILLATION?:[id number]}      Physical Exam:   VS:  There were no vitals taken for this visit.   No BP recorded.  {Refresh Note OR Click here to enter BP  :1}***   Wt Readings from Last 3 Encounters:  05/29/23 162 lb (73.5 kg)  05/25/23 174 lb 2.6 oz (79 kg)  11/23/22 174 lb 2.6 oz (79 kg)      GENERAL:  No apparent distress, AOx3 HEENT:  No carotid bruits, +2 carotid impulses, no scleral icterus CAR: RRR Irregular RR*** no murmurs***, gallops, rubs, or thrills RES:  Clear to auscultation bilaterally ABD:  Soft, nontender, nondistended, positive bowel sounds x 4 VASC:  +2 radial pulses, +2 carotid pulses NEURO:  CN 2-12 grossly intact; motor and sensory grossly intact PSYCH:  No active depression or anxiety EXT:  No edema, ecchymosis, or cyanosis  Signed, Trenell Moxey K Doniven Vanpatten, MD  01/06/2024 8:57 AM    [facility] [address] [place], [place], [place]  [address] Phone: [phone]; Fax: [phone]   Note:  This document was prepared using Dragon voice recognition software and may include unintentional dictation errors.

## 2024-01-10 ENCOUNTER — Ambulatory Visit: Payer: 59 | Admitting: Internal Medicine

## 2024-01-10 DIAGNOSIS — E118 Type 2 diabetes mellitus with unspecified complications: Secondary | ICD-10-CM

## 2024-01-10 DIAGNOSIS — N1832 Chronic kidney disease, stage 3b: Secondary | ICD-10-CM

## 2024-01-10 DIAGNOSIS — E1169 Type 2 diabetes mellitus with other specified complication: Secondary | ICD-10-CM

## 2024-01-10 DIAGNOSIS — Z6834 Body mass index (BMI) 34.0-34.9, adult: Secondary | ICD-10-CM

## 2024-01-10 DIAGNOSIS — I152 Hypertension secondary to endocrine disorders: Secondary | ICD-10-CM

## 2024-01-10 DIAGNOSIS — I700 Atherosclerosis of aorta: Secondary | ICD-10-CM

## 2024-02-12 DIAGNOSIS — R062 Wheezing: Secondary | ICD-10-CM | POA: Diagnosis not present

## 2024-02-12 DIAGNOSIS — R519 Headache, unspecified: Secondary | ICD-10-CM | POA: Diagnosis not present

## 2024-02-12 DIAGNOSIS — J019 Acute sinusitis, unspecified: Secondary | ICD-10-CM | POA: Diagnosis not present

## 2024-02-12 DIAGNOSIS — R059 Cough, unspecified: Secondary | ICD-10-CM | POA: Diagnosis not present

## 2024-02-12 DIAGNOSIS — R051 Acute cough: Secondary | ICD-10-CM | POA: Diagnosis not present

## 2024-02-12 DIAGNOSIS — R6883 Chills (without fever): Secondary | ICD-10-CM | POA: Diagnosis not present

## 2024-02-12 DIAGNOSIS — Z1159 Encounter for screening for other viral diseases: Secondary | ICD-10-CM | POA: Diagnosis not present

## 2024-02-12 DIAGNOSIS — J0100 Acute maxillary sinusitis, unspecified: Secondary | ICD-10-CM | POA: Diagnosis not present

## 2024-02-12 DIAGNOSIS — M19011 Primary osteoarthritis, right shoulder: Secondary | ICD-10-CM | POA: Diagnosis not present

## 2024-02-12 DIAGNOSIS — R053 Chronic cough: Secondary | ICD-10-CM | POA: Diagnosis not present

## 2024-02-14 NOTE — Progress Notes (Unsigned)
 Cardiology Office Note:  .   Date:  02/18/2024  ID:  Chelsea Kerr, DOB [birth year], MRN [id number] PCP: Erskine Emery, NP  [facility] Providers Cardiologist:  Orbie Pyo, MD    History of Present Illness: .   Chelsea Kerr is a 68 y.o. female with a past medical history of diabetes mellitus type 2, hypothyroidism, dyslipidemia, depression, anxiety.   01/29/2022 monitor average heart rate 80 bpm, predominant rhythm was sinus, 3 episodes of SVT occurred 09/01/2022 Lexiscan normal, low risk 09/01/2022 echo EF 60 to 65%, mild LVH, trivial MR, mild calcification without stenosis of the aortic valve  She established care with Dr. Lynnette Caffey on 08/30/2022 for preoperative evaluation, as well as palpitations.  Testing was arranged as outlined above, she was advised to follow-up in 9 months.  She presents today after she was recently evaluated by her PCP for an abnormal chest x-ray.  She had apparently been dealing with some type of viral process since January.  A chest x-ray was obtained which revealed enlargement of her cardiomediastinal silhouette, concerning for pericardial effusion. She has been bothered by shortness of breath, but hard for her to decipher if worse than normal as she states she has had asthma since childhood. She does have some chest heaviness when she leans forward. Ambulates with a cane.  She denies chest pain, palpitations,  pnd, orthopnea, n, v, dizziness, syncope, edema, weight gain, or early satiety.   ROS: Review of Systems  Constitutional:  Positive for malaise/fatigue.  Respiratory:  Positive for shortness of breath.   Musculoskeletal:  Positive for joint pain.  All other systems reviewed and are negative.    Studies Reviewed: Marland Kitchen   EKG Interpretation Date/Time:  Monday February 18 2024 14:06:02 EDT Ventricular Rate:  79 PR Interval:  172 QRS Duration:  74 QT Interval:  374 QTC Calculation: 428 R Axis:   63  Text Interpretation: Normal sinus rhythm Normal  ECG When compared with ECG of 25-May-2023 18:59, PREVIOUS ECG IS PRESENT Confirmed by Wallis Bamberg [phone]) on 02/18/2024 2:11:12 PM    Cardiac Studies & Procedures   ______________________________________________________________________________________________   STRESS TESTS  MYOCARDIAL PERFUSION IMAGING 09/01/2022  Narrative   The study is normal. Findings are consistent with no ischemia. The study is low risk.   No ST deviation was noted.   LV perfusion is normal.   Left ventricular function is normal. Nuclear stress EF: 71 %. The left ventricular ejection fraction is hyperdynamic (>65%). End diastolic cavity size is normal. End systolic cavity size is normal.   Prior study not available for comparison.   ECHOCARDIOGRAM  ECHOCARDIOGRAM COMPLETE 09/01/2022  Narrative ECHOCARDIOGRAM REPORT    Patient Name:   Chelsea Kerr Date of Exam: 09/01/2022 Medical Rec #:  [id number]     Height:       62.0 in Accession #:    [id number]    Weight:       194.0 lb Date of Birth:  08-Nov-1956     BSA:          1.887 m Patient Age:    66 years      BP:           145/75 mmHg Patient Gender: F             HR:           79 bpm. Exam Location:  [address]  Procedure: 2D Echo, Cardiac Doppler, Color Doppler and Intracardiac Opacification Agent  Indications:  R06.00 Dyspnea  History:        Patient has prior history of Echocardiogram examinations, most recent 12/15/2020. COPD, Signs/Symptoms:Dyspnea; Risk Factors:Family History of Coronary Artery Disease, Hypertension, Diabetes and Dyslipidemia. Palpitations, Asthma, Obesity, Pre-Operative Eval for Right Renal Cancer.  Sonographer:    Farrel Conners RDCS Referring Phys: [id number] Charlies Constable [facility]   Sonographer Comments: Technically difficult study due to poor echo windows. IMPRESSIONS   1. Left ventricular ejection fraction, by estimation, is 60 to 65%. The left ventricle has normal function. The left ventricle has no regional wall  motion abnormalities. There is mild left ventricular hypertrophy. Indeterminate diastolic filling due to E-A fusion. 2. Right ventricular systolic function is normal. The right ventricular size is normal. Tricuspid regurgitation signal is inadequate for assessing PA pressure. 3. The mitral valve is normal in structure. Trivial mitral valve regurgitation. No evidence of mitral stenosis. 4. The aortic valve is grossly normal. There is mild calcification of the aortic valve. Aortic valve regurgitation is not visualized. No aortic stenosis is present. 5. The inferior vena cava is normal in size with greater than 50% respiratory variability, suggesting right atrial pressure of 3 mmHg.  FINDINGS Left Ventricle: Left ventricular ejection fraction, by estimation, is 60 to 65%. The left ventricle has normal function. The left ventricle has no regional wall motion abnormalities. Definity contrast agent was given IV to delineate the left ventricular endocardial borders. The left ventricular internal cavity size was normal in size. There is mild left ventricular hypertrophy. Indeterminate diastolic filling due to E-A fusion.  Right Ventricle: The right ventricular size is normal. No increase in right ventricular wall thickness. Right ventricular systolic function is normal. Tricuspid regurgitation signal is inadequate for assessing PA pressure.  Left Atrium: Left atrial size was normal in size.  Right Atrium: Right atrial size was normal in size.  Pericardium: Trivial pericardial effusion is present.  Mitral Valve: The mitral valve is normal in structure. Trivial mitral valve regurgitation. No evidence of mitral valve stenosis.  Tricuspid Valve: The tricuspid valve is normal in structure. Tricuspid valve regurgitation is not demonstrated. No evidence of tricuspid stenosis.  Aortic Valve: The aortic valve is grossly normal. There is mild calcification of the aortic valve. Aortic valve regurgitation is not  visualized. No aortic stenosis is present.  Pulmonic Valve: The pulmonic valve was normal in structure. Pulmonic valve regurgitation is not visualized. No evidence of pulmonic stenosis.  Aorta: The aortic root is normal in size and structure.  Venous: The inferior vena cava is normal in size with greater than 50% respiratory variability, suggesting right atrial pressure of 3 mmHg.  IAS/Shunts: The interatrial septum was not well visualized.   LEFT VENTRICLE PLAX 2D LVIDd:         4.90 cm   Diastology LVIDs:         2.60 cm   LV e' medial:    6.42 cm/s LV PW:         1.10 cm   LV E/e' medial:  16.7 LV IVS:        1.00 cm   LV e' lateral:   7.40 cm/s LVOT diam:     2.30 cm   LV E/e' lateral: 14.5 LV SV:         99 LV SV Index:   53 LVOT Area:     4.15 cm   RIGHT VENTRICLE RV Basal diam:  3.40 cm RV S prime:     12.10 cm/s TAPSE (M-mode): 1.8 cm  LEFT  ATRIUM             Index        RIGHT ATRIUM           Index LA diam:        3.80 cm 2.01 cm/m   RA Area:     12.70 cm LA Vol (A2C):   73.6 ml 39.00 ml/m  RA Volume:   27.20 ml  14.41 ml/m LA Vol (A4C):   82.0 ml 43.45 ml/m LA Biplane Vol: 80.7 ml 42.76 ml/m AORTIC VALVE LVOT Vmax:   107.50 cm/s LVOT Vmean:  74.600 cm/s LVOT VTI:    0.239 m  AORTA Ao Root diam: 2.70 cm Ao Asc diam:  2.90 cm  MITRAL VALVE MV Area (PHT): cm          SHUNTS MV Decel Time: 203 msec     Systemic VTI:  0.24 m MV E velocity: 107.00 cm/s  Systemic Diam: 2.30 cm MV A velocity: 133.00 cm/s MV E/A ratio:  0.80  Weston Brass MD Electronically signed by Weston Brass MD Signature Date/Time: 09/02/2022/8:34:05 AM    Final    MONITORS  LONG TERM MONITOR (3-14 DAYS) 09/28/2022  Narrative Patch Wear Time:  2 days and 21 hours (2023-10-10T22:16:30-0400 to 2023-10-13T20:16:02-399)  Patient had a min HR of 60 bpm, max HR of 162 bpm, and avg HR of 80 bpm. Predominant underlying rhythm was Sinus Rhythm.  EVENTS: 3 Supraventricular  Tachycardia runs occurred, the run with the fastest interval lasting 8 beats with a max rate of 162 bpm, the longest lasting 1 min 10 secs with an avg rate of 109 bpm.  Isolated SVEs were rare (<1.0%), SVE Couplets were rare (<1.0%), and SVE Triplets were rare (<1.0%). Isolated VEs were rare (<1.0%), VE Couplets were rare (<1.0%), and no VE Triplets were present.  No atrial fibrillation, ventricular tachyarrhythmias, or bradyarrhythmias were detected.  Patient triggered events corresponded with sinus rhythm.       ______________________________________________________________________________________________      Risk Assessment/Calculations:             Physical Exam:   VS:  BP 132/74   Pulse 79   Ht 5' (1.524 m)   Wt 195 lb (88.5 kg)   SpO2 94%   BMI 38.08 kg/m    Wt Readings from Last 3 Encounters:  02/18/24 195 lb (88.5 kg)  05/29/23 162 lb (73.5 kg)  05/25/23 174 lb 2.6 oz (79 kg)    GEN: Well nourished, well developed in no acute distress NECK: No JVD; No carotid bruits CARDIAC: RRR, no murmurs, rubs, gallops RESPIRATORY:  Clear to auscultation without rales, wheezing or rhonchi  ABDOMEN: Soft, non-tender, non-distended EXTREMITIES:  No edema; No deformity   ASSESSMENT AND PLAN: .   Pericardial effusion - sonographer able to do a quick view of her heart, there is a small pericardial effusion, she also has small bilateral pleural effusions noted. Discussed with DOD Dr. Bing Matter, with recommendations for formal echo and referral to rheumatology to rule out any autoimmune component. She is relatively asymptomatic, oxygenating well. Currently on steroid per PCP.   DOE - will arrange for complete echocardiogram.   Aortic atherosclerosis-this was noted on imaging, continue Lovaza, aspirin.  Dyslipidemia-monitored by her PCP.  Hypertension-blood pressure is controlled at 132/74, continue lisinopril 10 mg daily.       Dispo: Echocardiogram. Referral to Rheumatology.    Signed, Flossie Dibble, NP

## 2024-02-15 ENCOUNTER — Encounter: Payer: Self-pay | Admitting: *Deleted

## 2024-02-18 ENCOUNTER — Telehealth: Payer: Self-pay | Admitting: Cardiology

## 2024-02-18 ENCOUNTER — Ambulatory Visit: Attending: Cardiology | Admitting: Cardiology

## 2024-02-18 ENCOUNTER — Encounter: Payer: Self-pay | Admitting: Cardiology

## 2024-02-18 VITALS — BP 132/74 | HR 79 | Ht 60.0 in | Wt 195.0 lb

## 2024-02-18 DIAGNOSIS — E039 Hypothyroidism, unspecified: Secondary | ICD-10-CM | POA: Diagnosis not present

## 2024-02-18 DIAGNOSIS — Z8553 Personal history of malignant neoplasm of renal pelvis: Secondary | ICD-10-CM | POA: Diagnosis not present

## 2024-02-18 DIAGNOSIS — E1159 Type 2 diabetes mellitus with other circulatory complications: Secondary | ICD-10-CM | POA: Diagnosis not present

## 2024-02-18 DIAGNOSIS — E785 Hyperlipidemia, unspecified: Secondary | ICD-10-CM

## 2024-02-18 DIAGNOSIS — I3139 Other pericardial effusion (noninflammatory): Secondary | ICD-10-CM

## 2024-02-18 DIAGNOSIS — J90 Pleural effusion, not elsewhere classified: Secondary | ICD-10-CM

## 2024-02-18 DIAGNOSIS — E1169 Type 2 diabetes mellitus with other specified complication: Secondary | ICD-10-CM | POA: Diagnosis not present

## 2024-02-18 DIAGNOSIS — Z5181 Encounter for therapeutic drug level monitoring: Secondary | ICD-10-CM | POA: Diagnosis not present

## 2024-02-18 DIAGNOSIS — Z79899 Other long term (current) drug therapy: Secondary | ICD-10-CM | POA: Diagnosis not present

## 2024-02-18 DIAGNOSIS — R0602 Shortness of breath: Secondary | ICD-10-CM | POA: Diagnosis not present

## 2024-02-18 DIAGNOSIS — R002 Palpitations: Secondary | ICD-10-CM | POA: Diagnosis not present

## 2024-02-18 DIAGNOSIS — G8929 Other chronic pain: Secondary | ICD-10-CM | POA: Diagnosis not present

## 2024-02-18 DIAGNOSIS — I152 Hypertension secondary to endocrine disorders: Secondary | ICD-10-CM | POA: Diagnosis not present

## 2024-02-18 DIAGNOSIS — I10 Essential (primary) hypertension: Secondary | ICD-10-CM

## 2024-02-18 DIAGNOSIS — N1832 Chronic kidney disease, stage 3b: Secondary | ICD-10-CM | POA: Diagnosis not present

## 2024-02-18 NOTE — Patient Instructions (Addendum)
 Medication Instructions:  Your physician recommends that you continue on your current medications as directed. Please refer to the Current Medication list given to you today.  *If you need a refill on your cardiac medications before your next appointment, please call your pharmacy*   Lab Work: NONE If you have labs (blood work) drawn today and your tests are completely normal, you will receive your results only by: MyChart Message (if you have MyChart) OR A paper copy in the mail If you have any lab test that is abnormal or we need to change your treatment, we will call you to review the results.   Testing/Procedures: Your physician has requested that you have an echocardiogram. Echocardiography is a painless test that uses sound waves to create images of your heart. It provides your doctor with information about the size and shape of your heart and how well your heart's chambers and valves are working. This procedure takes approximately one hour. There are no restrictions for this procedure. Please do NOT wear cologne, perfume, aftershave, or lotions (deodorant is allowed). Please arrive 15 minutes prior to your appointment time.  Please note: We ask at that you not bring children with you during ultrasound (echo/ vascular) testing. Due to room size and safety concerns, children are not allowed in the ultrasound rooms during exams. Our front office staff cannot provide observation of children in our lobby area while testing is being conducted. An adult accompanying a patient to their appointment will only be allowed in the ultrasound room at the discretion of the ultrasound technician under special circumstances. We apologize for any inconvenience.    Follow-Up: At [facility], you and your health needs are our priority.  As part of our continuing mission to provide you with exceptional heart care, we have created designated Provider Care Teams.  These Care Teams include your  primary Cardiologist (physician) and Advanced Practice Providers (APPs -  Physician Assistants and Nurse Practitioners) who all work together to provide you with the care you need, when you need it.  We recommend signing up for the patient portal called "MyChart".  Sign up information is provided on this After Visit Summary.  MyChart is used to connect with patients for Virtual Visits (Telemedicine).  Patients are able to view lab/test results, encounter notes, upcoming appointments, etc.  Non-urgent messages can be sent to your provider as well.   To learn more about what you can do with MyChart, go to [email or web address].    Your next appointment:  F/U Based on testing    Provider:   Huntley Dec, MD    Other Instructions Doing Echocardiogram to look for Pericardial Effusion:  a condition where excessive fluid accumulates in the pericardial sac, the membrane that surrounds the heart. This fluid can put pressure on the heart, making it difficult to pump effectively.   Echocardiogram An echocardiogram is a test that uses sound waves to make images of your heart. This way of making images is often called ultrasound. The images from this test can help find out many things about your heart, including: The size and shape of your heart. The strength of your heart muscle and how well it's working. The size, thickness, and movement of your heart's walls. How your heart valves are working. Problems such as: A tumor or a growth from an infection around the heart valves. Areas of heart muscle that aren't working well because of poor blood flow or injury from a heart attack. An aneurysm. This is  a weak or damaged part of an artery wall. An artery is a blood vessel. Tell a health care provider about: Any allergies you have. All medicines you're taking, including vitamins, herbs, eye drops, creams, and over-the-counter medicines. Any bleeding problems you have. Any surgeries you've  had. Any medical problems you have. Whether you're pregnant or may be pregnant. What are the risks? Your health care provider will talk with you about risks. These may include an allergic reaction to IV dye that may be used during the test. What happens before the test? You don't need to do anything to get ready for this test. You may eat and drink normally. What happens during the test?  You'll take off your clothes from the waist up and put on a hospital gown. Sticky patches called electrodes may be placed on your chest. These will be connected to a machine that monitors your heart rate and rhythm. You'll lie down on a table for the exam. A wand covered in gel will be moved over your chest. Sound waves from the wand will go to your heart and bounce back--or "echo" back. The sound waves will go to a computer that uses them to make images of your heart. The images can be viewed on a monitor. The images will also be recorded on the computer so your provider can look at them later. You may be asked to change positions or hold your breath for a short time. This makes it easier to get different views or better views of your heart. In some cases, you may be given a dye through an IV. The IV is put into one of your veins. This dye can make the areas of your heart easier to see. The procedure may vary among providers and hospitals. What can I expect after the test? You may return to your normal diet, activities, and medicines unless your provider tells you not to. If an IV was placed for the test, it will be removed. It's up to you to get the results of your test. Ask your provider, or the department that's doing the test, when your results will be ready. This information is not intended to replace advice given to you by your health care provider. Make sure you discuss any questions you have with your health care provider. Document Revised: 01/19/2023 Document Reviewed: 01/19/2023 Elsevier Patient  Education  2024 [employer].

## 2024-02-18 NOTE — Telephone Encounter (Signed)
Referral for rheumatology placed. 

## 2024-02-19 NOTE — Addendum Note (Signed)
 Addended by: Lonia Farber on: 02/19/2024 09:27 AM   Modules accepted: Orders

## 2024-02-21 ENCOUNTER — Other Ambulatory Visit: Payer: Self-pay | Admitting: Student

## 2024-02-21 DIAGNOSIS — Z1231 Encounter for screening mammogram for malignant neoplasm of breast: Secondary | ICD-10-CM

## 2024-02-21 DIAGNOSIS — N959 Unspecified menopausal and perimenopausal disorder: Secondary | ICD-10-CM

## 2024-02-25 DIAGNOSIS — N390 Urinary tract infection, site not specified: Secondary | ICD-10-CM | POA: Diagnosis not present

## 2024-02-25 DIAGNOSIS — N1832 Chronic kidney disease, stage 3b: Secondary | ICD-10-CM | POA: Diagnosis not present

## 2024-02-29 ENCOUNTER — Ambulatory Visit: Attending: Cardiology

## 2024-02-29 DIAGNOSIS — R002 Palpitations: Secondary | ICD-10-CM

## 2024-02-29 DIAGNOSIS — I152 Hypertension secondary to endocrine disorders: Secondary | ICD-10-CM

## 2024-02-29 DIAGNOSIS — E1169 Type 2 diabetes mellitus with other specified complication: Secondary | ICD-10-CM | POA: Diagnosis not present

## 2024-02-29 DIAGNOSIS — E785 Hyperlipidemia, unspecified: Secondary | ICD-10-CM

## 2024-02-29 DIAGNOSIS — E1159 Type 2 diabetes mellitus with other circulatory complications: Secondary | ICD-10-CM | POA: Diagnosis not present

## 2024-02-29 LAB — ECHOCARDIOGRAM COMPLETE
Area-P 1/2: 3.24 cm2
MV M vel: 4.13 m/s
MV Peak grad: 68.2 mmHg
S' Lateral: 2.7 cm

## 2024-02-29 MED ORDER — PERFLUTREN LIPID MICROSPHERE
1.0000 mL | INTRAVENOUS | Status: AC | PRN
  Administered 2024-02-29: 6 mL via INTRAVENOUS

## 2024-03-03 ENCOUNTER — Telehealth: Payer: Self-pay | Admitting: Emergency Medicine

## 2024-03-03 DIAGNOSIS — N189 Chronic kidney disease, unspecified: Secondary | ICD-10-CM | POA: Diagnosis not present

## 2024-03-03 DIAGNOSIS — D631 Anemia in chronic kidney disease: Secondary | ICD-10-CM | POA: Diagnosis not present

## 2024-03-03 DIAGNOSIS — N1832 Chronic kidney disease, stage 3b: Secondary | ICD-10-CM | POA: Diagnosis not present

## 2024-03-03 DIAGNOSIS — I129 Hypertensive chronic kidney disease with stage 1 through stage 4 chronic kidney disease, or unspecified chronic kidney disease: Secondary | ICD-10-CM | POA: Diagnosis not present

## 2024-03-03 DIAGNOSIS — N2581 Secondary hyperparathyroidism of renal origin: Secondary | ICD-10-CM | POA: Diagnosis not present

## 2024-03-03 NOTE — Telephone Encounter (Signed)
-----   Message from Flossie Dibble sent at 03/03/2024  8:13 AM EDT ----- Echo showed that your heart is squeezing well and slightly stiff when it relaxes. Mild leaking around mitral valve. No evidence of pericardial effusion-- which is why we repeated this to look for fluid in the pericardial sac. Reassuring result.

## 2024-03-03 NOTE — Telephone Encounter (Signed)
 No answer

## 2024-03-04 NOTE — Telephone Encounter (Signed)
 Called and spoke to patient. Reviewed echo results with patient as per [facility] note.She verbalized understanding and had no further questions.

## 2024-03-06 DIAGNOSIS — I10 Essential (primary) hypertension: Secondary | ICD-10-CM | POA: Insufficient documentation

## 2024-03-06 DIAGNOSIS — R7303 Prediabetes: Secondary | ICD-10-CM | POA: Insufficient documentation

## 2024-03-06 DIAGNOSIS — D649 Anemia, unspecified: Secondary | ICD-10-CM | POA: Insufficient documentation

## 2024-03-06 DIAGNOSIS — F32A Depression, unspecified: Secondary | ICD-10-CM | POA: Insufficient documentation

## 2024-03-06 DIAGNOSIS — R112 Nausea with vomiting, unspecified: Secondary | ICD-10-CM | POA: Insufficient documentation

## 2024-03-06 DIAGNOSIS — M797 Fibromyalgia: Secondary | ICD-10-CM | POA: Insufficient documentation

## 2024-03-06 DIAGNOSIS — Z8719 Personal history of other diseases of the digestive system: Secondary | ICD-10-CM | POA: Insufficient documentation

## 2024-03-06 DIAGNOSIS — E119 Type 2 diabetes mellitus without complications: Secondary | ICD-10-CM | POA: Insufficient documentation

## 2024-03-06 DIAGNOSIS — J189 Pneumonia, unspecified organism: Secondary | ICD-10-CM | POA: Insufficient documentation

## 2024-03-06 DIAGNOSIS — J449 Chronic obstructive pulmonary disease, unspecified: Secondary | ICD-10-CM | POA: Insufficient documentation

## 2024-03-06 DIAGNOSIS — M1990 Unspecified osteoarthritis, unspecified site: Secondary | ICD-10-CM | POA: Insufficient documentation

## 2024-03-06 DIAGNOSIS — I96 Gangrene, not elsewhere classified: Secondary | ICD-10-CM | POA: Insufficient documentation

## 2024-03-06 DIAGNOSIS — R519 Headache, unspecified: Secondary | ICD-10-CM | POA: Insufficient documentation

## 2024-03-06 DIAGNOSIS — R0600 Dyspnea, unspecified: Secondary | ICD-10-CM | POA: Insufficient documentation

## 2024-03-07 ENCOUNTER — Ambulatory Visit: Admitting: Nurse Practitioner

## 2024-03-07 ENCOUNTER — Ambulatory Visit

## 2024-03-07 VITALS — BP 124/78 | HR 72 | Ht 60.0 in | Wt 196.4 lb

## 2024-03-07 DIAGNOSIS — R002 Palpitations: Secondary | ICD-10-CM

## 2024-03-07 DIAGNOSIS — I3139 Other pericardial effusion (noninflammatory): Secondary | ICD-10-CM | POA: Insufficient documentation

## 2024-03-07 DIAGNOSIS — Z8719 Personal history of other diseases of the digestive system: Secondary | ICD-10-CM

## 2024-03-07 DIAGNOSIS — I96 Gangrene, not elsewhere classified: Secondary | ICD-10-CM

## 2024-03-07 DIAGNOSIS — M1990 Unspecified osteoarthritis, unspecified site: Secondary | ICD-10-CM

## 2024-03-07 DIAGNOSIS — E119 Type 2 diabetes mellitus without complications: Secondary | ICD-10-CM

## 2024-03-07 DIAGNOSIS — R112 Nausea with vomiting, unspecified: Secondary | ICD-10-CM

## 2024-03-07 DIAGNOSIS — M797 Fibromyalgia: Secondary | ICD-10-CM

## 2024-03-07 DIAGNOSIS — R7303 Prediabetes: Secondary | ICD-10-CM

## 2024-03-07 HISTORY — DX: Other pericardial effusion (noninflammatory): I31.39

## 2024-03-07 HISTORY — DX: Palpitations: R00.2

## 2024-03-07 NOTE — Assessment & Plan Note (Signed)
 No significant pericardial effusion noted on full transthoracic echocardiogram. Review of images myself, there could be a trace pericardial effusion but no significant accumulation to be of concern.  Reviewed the echocardiogram results at length reassuring that the pumping function of the heart is normal there is mildly increased stiffness of the heart muscle.  Discussed about no significant amount of fluid around the heart.

## 2024-03-07 NOTE — Assessment & Plan Note (Signed)
 Symptoms of palpitations with exertion.  Discussed further evaluation with 14-day Zio patch monitoring. She is agreeable.
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
## 2024-03-07 NOTE — Patient Instructions (Signed)
 Medication Instructions:  Your physician recommends that you continue on your current medications as directed. Please refer to the Current Medication list given to you today.  *If you need a refill on your cardiac medications before your next appointment, please call your pharmacy*  Lab Work: None If you have labs (blood work) drawn today and your tests are completely normal, you will receive your results only by: MyChart Message (if you have MyChart) OR A paper copy in the mail If you have any lab test that is abnormal or we need to change your treatment, we will call you to review the results.  Testing/Procedures: A zio monitor was ordered today. It will remain on for 14 days. You will then return monitor and event diary in provided box. It takes 1-2 weeks for report to be downloaded and returned to [place]. We will call you with the results. If monitor falls off or has orange flashing light, please call Zio for further instructions.    Follow-Up: At [facility], you and your health needs are our priority.  As part of our continuing mission to provide you with exceptional heart care, our providers are all part of one team.  This team includes your primary Cardiologist (physician) and Advanced Practice Providers or APPs (Physician Assistants and Nurse Practitioners) who all work together to provide you with the care you need, when you need it.  Your next appointment:   Follow up to be determined after tesing  Provider:   Huntley Dec, MD    We recommend signing up for the patient portal called "MyChart".  Sign up information is provided on this After Visit Summary.  MyChart is used to connect with patients for Virtual Visits (Telemedicine).  Patients are able to view lab/test results, encounter notes, upcoming appointments, etc.  Non-urgent messages can be sent to your provider as well.   To learn more about what you can do with MyChart, go to [email or web address].   Other  Instructions None

## 2024-03-26 DIAGNOSIS — R002 Palpitations: Secondary | ICD-10-CM | POA: Diagnosis not present

## 2024-03-28 DIAGNOSIS — R062 Wheezing: Secondary | ICD-10-CM | POA: Diagnosis not present

## 2024-03-28 DIAGNOSIS — R0602 Shortness of breath: Secondary | ICD-10-CM | POA: Diagnosis not present

## 2024-03-28 DIAGNOSIS — M1990 Unspecified osteoarthritis, unspecified site: Secondary | ICD-10-CM | POA: Diagnosis not present

## 2024-03-28 DIAGNOSIS — N1832 Chronic kidney disease, stage 3b: Secondary | ICD-10-CM | POA: Diagnosis not present

## 2024-03-28 DIAGNOSIS — J45909 Unspecified asthma, uncomplicated: Secondary | ICD-10-CM | POA: Diagnosis not present

## 2024-04-07 ENCOUNTER — Ambulatory Visit: Payer: 59 | Admitting: Internal Medicine

## 2024-04-09 DIAGNOSIS — J301 Allergic rhinitis due to pollen: Secondary | ICD-10-CM | POA: Diagnosis not present

## 2024-04-09 DIAGNOSIS — G4733 Obstructive sleep apnea (adult) (pediatric): Secondary | ICD-10-CM | POA: Diagnosis not present

## 2024-04-09 DIAGNOSIS — J4540 Moderate persistent asthma, uncomplicated: Secondary | ICD-10-CM | POA: Diagnosis not present

## 2024-04-09 DIAGNOSIS — R400 Somnolence: Secondary | ICD-10-CM | POA: Diagnosis not present

## 2024-04-23 ENCOUNTER — Telehealth: Payer: Self-pay

## 2024-04-23 NOTE — Telephone Encounter (Signed)
Patient called to follow-up on heart monitor test results.

## 2024-04-24 ENCOUNTER — Ambulatory Visit: Payer: Self-pay

## 2024-04-24 DIAGNOSIS — R002 Palpitations: Secondary | ICD-10-CM

## 2024-04-24 NOTE — Telephone Encounter (Signed)
 Informed patient of Dr. Madireddy's recommendations.  She thanked me for the call and had no additional questions.

## 2024-04-30 DIAGNOSIS — M1990 Unspecified osteoarthritis, unspecified site: Secondary | ICD-10-CM | POA: Insufficient documentation

## 2024-05-01 DIAGNOSIS — N1832 Chronic kidney disease, stage 3b: Secondary | ICD-10-CM | POA: Diagnosis not present

## 2024-05-01 DIAGNOSIS — J4540 Moderate persistent asthma, uncomplicated: Secondary | ICD-10-CM | POA: Diagnosis not present

## 2024-05-01 DIAGNOSIS — I10 Essential (primary) hypertension: Secondary | ICD-10-CM | POA: Diagnosis not present

## 2024-05-01 DIAGNOSIS — E538 Deficiency of other specified B group vitamins: Secondary | ICD-10-CM | POA: Diagnosis not present

## 2024-05-01 DIAGNOSIS — G8929 Other chronic pain: Secondary | ICD-10-CM | POA: Diagnosis not present

## 2024-05-01 DIAGNOSIS — R7303 Prediabetes: Secondary | ICD-10-CM | POA: Diagnosis not present

## 2024-05-01 DIAGNOSIS — M2550 Pain in unspecified joint: Secondary | ICD-10-CM | POA: Diagnosis not present

## 2024-05-27 DIAGNOSIS — N1832 Chronic kidney disease, stage 3b: Secondary | ICD-10-CM | POA: Diagnosis not present

## 2024-05-27 DIAGNOSIS — N390 Urinary tract infection, site not specified: Secondary | ICD-10-CM | POA: Diagnosis not present

## 2024-05-29 DIAGNOSIS — E038 Other specified hypothyroidism: Secondary | ICD-10-CM | POA: Diagnosis not present

## 2024-05-29 DIAGNOSIS — E559 Vitamin D deficiency, unspecified: Secondary | ICD-10-CM | POA: Diagnosis not present

## 2024-05-29 DIAGNOSIS — E538 Deficiency of other specified B group vitamins: Secondary | ICD-10-CM | POA: Diagnosis not present

## 2024-05-29 DIAGNOSIS — I129 Hypertensive chronic kidney disease with stage 1 through stage 4 chronic kidney disease, or unspecified chronic kidney disease: Secondary | ICD-10-CM | POA: Diagnosis not present

## 2024-05-29 DIAGNOSIS — G8929 Other chronic pain: Secondary | ICD-10-CM | POA: Diagnosis not present

## 2024-05-29 DIAGNOSIS — M1990 Unspecified osteoarthritis, unspecified site: Secondary | ICD-10-CM | POA: Diagnosis not present

## 2024-05-29 DIAGNOSIS — N1832 Chronic kidney disease, stage 3b: Secondary | ICD-10-CM | POA: Diagnosis not present

## 2024-05-29 DIAGNOSIS — E7849 Other hyperlipidemia: Secondary | ICD-10-CM | POA: Diagnosis not present

## 2024-05-30 LAB — LAB REPORT - SCANNED
EGFR: 32.4
Free T4: 1.24 ng/dL
TSH: 4.53 (ref 0.41–5.90)

## 2024-06-03 DIAGNOSIS — N1832 Chronic kidney disease, stage 3b: Secondary | ICD-10-CM | POA: Diagnosis not present

## 2024-06-03 DIAGNOSIS — N2581 Secondary hyperparathyroidism of renal origin: Secondary | ICD-10-CM | POA: Diagnosis not present

## 2024-06-03 DIAGNOSIS — D631 Anemia in chronic kidney disease: Secondary | ICD-10-CM | POA: Diagnosis not present

## 2024-06-03 DIAGNOSIS — I129 Hypertensive chronic kidney disease with stage 1 through stage 4 chronic kidney disease, or unspecified chronic kidney disease: Secondary | ICD-10-CM | POA: Diagnosis not present

## 2024-07-04 ENCOUNTER — Telehealth: Payer: Self-pay

## 2024-07-04 NOTE — Telephone Encounter (Signed)
 Called both of patient phone numbers to get her scheduled for an appointment with Dr. Delynn as he requested.  No answer and number not in service.  I will attempt to call patient back later.

## 2024-07-04 NOTE — Telephone Encounter (Signed)
-----   Message from Alean SAUNDERS Madireddy sent at 07/03/2024  3:27 PM EDT ----- Needs follow-up visit to review lipid management.  Lipid panel from PCPs office 05/29/2024 abnormal.  Addended last office visit note.  Please schedule for next available with me thank you

## 2024-07-11 ENCOUNTER — Ambulatory Visit (HOSPITAL_COMMUNITY)
Admission: RE | Admit: 2024-07-11 | Discharge: 2024-07-11 | Disposition: A | Discharge status: Home / Self Care | Source: Ambulatory Visit | Attending: Urology | Admitting: Urology

## 2024-07-11 ENCOUNTER — Other Ambulatory Visit (HOSPITAL_COMMUNITY): Payer: Self-pay | Admitting: Urology

## 2024-07-11 DIAGNOSIS — I517 Cardiomegaly: Secondary | ICD-10-CM | POA: Diagnosis not present

## 2024-07-11 DIAGNOSIS — C641 Malignant neoplasm of right kidney, except renal pelvis: Secondary | ICD-10-CM | POA: Diagnosis not present

## 2024-07-16 DIAGNOSIS — K5730 Diverticulosis of large intestine without perforation or abscess without bleeding: Secondary | ICD-10-CM | POA: Diagnosis not present

## 2024-07-16 DIAGNOSIS — C641 Malignant neoplasm of right kidney, except renal pelvis: Secondary | ICD-10-CM | POA: Diagnosis not present

## 2024-07-16 DIAGNOSIS — Z9049 Acquired absence of other specified parts of digestive tract: Secondary | ICD-10-CM | POA: Diagnosis not present

## 2024-07-16 DIAGNOSIS — Z905 Acquired absence of kidney: Secondary | ICD-10-CM | POA: Diagnosis not present

## 2024-07-18 DIAGNOSIS — R051 Acute cough: Secondary | ICD-10-CM | POA: Diagnosis not present

## 2024-07-18 DIAGNOSIS — R0989 Other specified symptoms and signs involving the circulatory and respiratory systems: Secondary | ICD-10-CM | POA: Diagnosis not present

## 2024-07-18 DIAGNOSIS — J019 Acute sinusitis, unspecified: Secondary | ICD-10-CM | POA: Diagnosis not present

## 2024-07-18 DIAGNOSIS — J449 Chronic obstructive pulmonary disease, unspecified: Secondary | ICD-10-CM | POA: Diagnosis not present

## 2024-07-18 DIAGNOSIS — J309 Allergic rhinitis, unspecified: Secondary | ICD-10-CM | POA: Diagnosis not present

## 2024-07-18 DIAGNOSIS — Z79899 Other long term (current) drug therapy: Secondary | ICD-10-CM | POA: Diagnosis not present

## 2024-07-18 DIAGNOSIS — M79642 Pain in left hand: Secondary | ICD-10-CM | POA: Diagnosis not present

## 2024-07-18 DIAGNOSIS — M79641 Pain in right hand: Secondary | ICD-10-CM | POA: Diagnosis not present

## 2024-07-18 DIAGNOSIS — E538 Deficiency of other specified B group vitamins: Secondary | ICD-10-CM | POA: Diagnosis not present

## 2024-08-05 ENCOUNTER — Inpatient Hospital Stay (HOSPITAL_BASED_OUTPATIENT_CLINIC_OR_DEPARTMENT_OTHER): Admission: RE | Admit: 2024-08-05 | Source: Ambulatory Visit | Admitting: Radiology

## 2024-08-18 DIAGNOSIS — Z905 Acquired absence of kidney: Secondary | ICD-10-CM | POA: Diagnosis not present

## 2024-08-18 DIAGNOSIS — N183 Chronic kidney disease, stage 3 unspecified: Secondary | ICD-10-CM | POA: Diagnosis not present

## 2024-09-01 ENCOUNTER — Ambulatory Visit

## 2024-09-01 VITALS — BP 142/86 | HR 82 | Ht 60.0 in | Wt 200.2 lb

## 2024-09-01 DIAGNOSIS — E782 Mixed hyperlipidemia: Secondary | ICD-10-CM | POA: Diagnosis not present

## 2024-09-01 DIAGNOSIS — R55 Syncope and collapse: Secondary | ICD-10-CM

## 2024-09-01 DIAGNOSIS — I10 Essential (primary) hypertension: Secondary | ICD-10-CM

## 2024-09-01 DIAGNOSIS — R079 Chest pain, unspecified: Secondary | ICD-10-CM | POA: Insufficient documentation

## 2024-09-01 DIAGNOSIS — I340 Nonrheumatic mitral (valve) insufficiency: Secondary | ICD-10-CM | POA: Diagnosis not present

## 2024-09-01 HISTORY — DX: Nonrheumatic mitral (valve) insufficiency: I34.0

## 2024-09-01 MED ORDER — AMLODIPINE BESYLATE 10 MG PO TABS: 10.0000 mg | ORAL_TABLET | Freq: Every day | ORAL | 3 refills | Status: DC

## 2024-09-01 MED ORDER — ROSUVASTATIN CALCIUM 5 MG PO TABS: 5.0000 mg | ORAL_TABLET | Freq: Every day | ORAL | 3 refills | Status: DC

## 2024-09-01 NOTE — Assessment & Plan Note (Signed)
 Suboptimal. Target blood pressure below 130/80 mmHg. Titrate up amlodipine dose to 10 mg once daily. Continue lisinopril 20 mg once daily.

## 2024-09-01 NOTE — Assessment & Plan Note (Signed)
 Doing those of syncope in the past 2 weeks. 1 in the shower and the second while seated on the couch. Unknown duration of loss of consciousness both times. Preceded by lightheadedness.  Proceed with Zio patch for 2 weeks. Advised her to sit down immediately at the onset of any symptoms of lightheadedness.
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
## 2024-09-01 NOTE — Assessment & Plan Note (Signed)
 Suboptimal control. Lipid panel from 05/29/2024 notes LDL 191, total cholesterol 301, triglycerides 429, HDL 33. Good compliance with her current medications fenofibrate , Vascepa, fish oil.  In addition to these medications will start Crestor  5 mg once daily. If tolerating well, follow-up with complete metabolic panel in 4 weeks to ensure liver functions are unaffected.

## 2024-09-01 NOTE — Assessment & Plan Note (Addendum)
 Chest pain on exertion and associated with dyspnea on exertion. In the setting of cardiovascular risk factors. Prior ischemic workup September 2023 Lexiscan  stress test showed no ischemia. Her echocardiogram from March 2025 was reassuring with normal biventricular function LVEF 60 to 65% and grade 1 diastolic dysfunction  With her ongoing symptoms of progressive chest pressure on exertion and dyspnea on exertion, will assess for any significant ischemia burden.  Differential diagnosis also includes uncontrolled COPD/asthma.  Although on physical exam no significant wheezing noted.  - Proceed with cardiac PET stress test to rule out any significant ischemia burden. [With renal function, poor candidate for CT coronary angiogram or cardiac catheterization; with body habitus and abdominal obesity high risk for artifacts with SPECT imaging alone]

## 2024-09-01 NOTE — Assessment & Plan Note (Signed)
 Reported on prior echocardiogram March 2025. Will recommend follow-up with repeat echocardiogram tentatively in 2 years.

## 2024-09-01 NOTE — Patient Instructions (Signed)
 Medication Instructions:  Your physician has recommended you make the following change in your medication:   START: Crestor  5 mg daily START: Amlodipine 10 mg daily  *If you need a refill on your cardiac medications before your next appointment, please call your pharmacy*  Lab Work: Your physician recommends that you return for lab work in:   Labs in 4 weeks: CMP   If you have labs (blood work) drawn today and your tests are completely normal, you will receive your results only by: [employer] (if you have MyChart) OR A paper copy in the mail If you have any lab test that is abnormal or we need to change your treatment, we will call you to review the results.  Testing/Procedures: A zio monitor was ordered today. It will remain on for 14 days. You will then return monitor and event diary in provided box. It takes 1-2 weeks for report to be downloaded and returned to us . We will call you with the results. If monitor falls off or has orange flashing light, please call Zio for further instructions.   Cardiac PET Test  Follow-Up: At [facility], you and your health needs are our priority.  As part of our continuing mission to provide you with exceptional heart care, our providers are all part of one team.  This team includes your primary Cardiologist (physician) and Advanced Practice Providers or APPs (Physician Assistants and Nurse Practitioners) who all work together to provide you with the care you need, when you need it.  Your next appointment:   2 month(s)  Provider:   Alean Kobus, MD    We recommend signing up for the patient portal called MyChart.  Sign up information is provided on this After Visit Summary.  MyChart is used to connect with patients for Virtual Visits (Telemedicine).  Patients are able to view lab/test results, encounter notes, upcoming appointments, etc.  Non-urgent messages can be sent to your provider as well.   To learn more about what  you can do with MyChart, go to [email or web address].   Other Instructions None

## 2024-10-07 DIAGNOSIS — E538 Deficiency of other specified B group vitamins: Secondary | ICD-10-CM | POA: Insufficient documentation

## 2024-10-07 DIAGNOSIS — J069 Acute upper respiratory infection, unspecified: Secondary | ICD-10-CM | POA: Insufficient documentation

## 2024-10-22 ENCOUNTER — Ambulatory Visit

## 2024-10-22 ENCOUNTER — Other Ambulatory Visit

## 2024-10-23 ENCOUNTER — Inpatient Hospital Stay (HOSPITAL_BASED_OUTPATIENT_CLINIC_OR_DEPARTMENT_OTHER): Admission: RE | Admit: 2024-10-23 | Source: Ambulatory Visit | Admitting: Radiology

## 2024-10-29 ENCOUNTER — Ambulatory Visit: Payer: Self-pay

## 2024-10-29 DIAGNOSIS — R55 Syncope and collapse: Secondary | ICD-10-CM | POA: Diagnosis not present

## 2024-11-04 ENCOUNTER — Ambulatory Visit

## 2024-11-11 ENCOUNTER — Ambulatory Visit

## 2024-11-11 VITALS — BP 160/82 | HR 80 | Ht 60.0 in | Wt 196.0 lb

## 2024-11-11 DIAGNOSIS — I10 Essential (primary) hypertension: Secondary | ICD-10-CM

## 2024-11-11 DIAGNOSIS — R079 Chest pain, unspecified: Secondary | ICD-10-CM

## 2024-11-11 DIAGNOSIS — E782 Mixed hyperlipidemia: Secondary | ICD-10-CM

## 2024-11-11 DIAGNOSIS — R55 Syncope and collapse: Secondary | ICD-10-CM

## 2024-11-11 MED ORDER — AMLODIPINE BESYLATE 10 MG PO TABS: 10.0000 mg | ORAL_TABLET | Freq: Every day | ORAL | 3 refills | Status: AC

## 2024-11-11 NOTE — Assessment & Plan Note (Signed)
 Recurrent syncopal episodes. Description of her symptoms may suggest vasovagal, however these are recurrent and no obvious warning signs and she is unable to sit down immediately at onset of symptoms. Cannot entirely exclude arrhythmias as potential underlying cause.  Heart monitor at various times over the last year failed to capture any obvious abnormality to explain her symptoms.  Further extended monitoring with loop recorder was discussed and she is favorable. Will refer to EP.

## 2024-11-11 NOTE — Assessment & Plan Note (Addendum)
 Suboptimal on lipid panel from 05/29/2024. lipid panel reviewed from 05/29/2024 total cholesterol 301, triglycerides 429, HDL 33 and LDL 191. Labs from 07/11/2024 normal with AST 20, ALT 25 and alkaline phosphatase 59.  Continue fenofibrate  145 mg once daily Continue Vascepa 2 g twice daily Continue Crestor  20 mg once daily.  Will repeat fasting lipid panel at her next follow-up visit tentatively in 3 months.
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
## 2024-11-11 NOTE — Assessment & Plan Note (Signed)
 Symptoms of chest pain and shortness of breath on exertion suggestive of possible anginal equivalent. She does have significant cardiovascular risk factors. Although symptoms could also be related to deconditioning, underlying anemia.  From cardiac standpoint proceed with cardiac PET stress test as previously recommended and ordered, she avoided scheduling the test given ongoing issues at home. He is agreeable and will go ahead and proceed the test. Alternate options of cardiac cath and cardiac CT coronary angiogram less than ideal given her underlying renal function.  Continue aspirin 81 mg once daily. If any significant worsening of symptoms acutely advised her to call 911 or get to the ER.

## 2024-11-11 NOTE — Patient Instructions (Addendum)
 Medication Instructions:  Your physician has recommended you make the following change in your medication:   Increase Amlodipine  to 10 mg daily  *If you need a refill on your cardiac medications before your next appointment, please call your pharmacy*   Lab Work: None ordered If you have labs (blood work) drawn today and your tests are completely normal, you will receive your results only by: MyChart Message (if you have MyChart) OR A paper copy in the mail If you have any lab test that is abnormal or we need to change your treatment, we will call you to review the results.  How to Prepare for Your Cardiac PET/CT Stress Test:  1. Please do not take these medications before your test:   Medications that may interfere with the cardiac pharmacological stress agent (ex. nitrates - including erectile dysfunction medications, isosorbide mononitrate- [please start to hold this medication the day before the test], tamulosin or beta-blockers) the day of the exam. (Erectile dysfunction medication should be held for at least 72 hrs prior to test) Theophylline containing medications for 12 hours. Dipyridamole 48 hours prior to the test. Your remaining medications may be taken with water .  2. Nothing to eat or drink, except water , 3 hours prior to arrival time.   NO caffeine/decaffeinated products, or chocolate 12 hours prior to arrival.  3. NO perfume, cologne or lotion on chest or abdomen area.          - FEMALES - Please avoid wearing dresses to this appointment.  4. Total time is 1 to 2 hours; you may want to bring reading material for the waiting time.  5. Please report to Radiology at the [facility] Main Entrance 30 minutes early for your test.  [address] [place], [place] [address]  Diabetic Preparation:  Hold oral medications. You may take NPH and Lantus  insulin . Do not take Humalog or Humulin R  (Regular Insulin ) the day of your test. Check blood sugars prior to  leaving the house. If able to eat breakfast prior to 3 hour fasting, you may take all medications, including your insulin , Do not worry if you miss your breakfast dose of insulin  - start at your next meal. Patients who wear a continuous glucose monitor MUST remove the device prior to scanning.  IF YOU THINK YOU MAY BE PREGNANT, OR ARE NURSING PLEASE INFORM THE TECHNOLOGIST.  In preparation for your appointment, medication and supplies will be purchased.  Appointment availability is limited, so if you need to cancel or reschedule, please call the Radiology Department at [phone] Ger[facility] Law) OR [phone] [facility])  24 hours in advance to avoid a cancellation fee of $100.00  What to Expect After you Arrive:  Once you arrive and check in for your appointment, you will be taken to a preparation room within the Radiology Department.  A technologist or Nurse will obtain your medical history, verify that you are correctly prepped for the exam, and explain the procedure.  Afterwards,  an IV will be started in your arm and electrodes will be placed on your skin for EKG monitoring during the stress portion of the exam. Then you will be escorted to the PET/CT scanner.  There, staff will get you positioned on the scanner and obtain a blood pressure and EKG.  During the exam, you will continue to be connected to the EKG and blood pressure machines.  A small, safe amount of a radioactive tracer will be injected in your IV to obtain a series  of pictures of your heart along with an injection of a stress agent.    After your Exam:  It is recommended that you eat a meal and drink a caffeinated beverage to counter act any effects of the stress agent.  Drink plenty of fluids for the remainder of the day and urinate frequently for the first couple of hours after the exam.  Your doctor will inform you of your test results within 7-10 business days.  For more information and frequently asked questions, please visit  our website : [email or web address]  For questions about your test or how to prepare for your test, please call: Cardiac Imaging Nurse Navigators Office: [phone]   Follow-Up: At [facility], you and your health needs are our priority.  As part of our continuing mission to provide you with exceptional heart care, we have created designated Provider Care Teams.  These Care Teams include your primary Cardiologist (physician) and Advanced Practice Providers (APPs -  Physician Assistants and Nurse Practitioners) who all work together to provide you with the care you need, when you need it.  We recommend signing up for the patient portal called MyChart.  Sign up information is provided on this After Visit Summary.  MyChart is used to connect with patients for Virtual Visits (Telemedicine).  Patients are able to view lab/test results, encounter notes, upcoming appointments, etc.  Non-urgent messages can be sent to your provider as well.   To learn more about what you can do with MyChart, go to [email or web address].    Your next appointment:   3 month(s)  The format for your next appointment:   In Person  Provider:   Alean Kobus, MD    Other Instructions none  Important Information About Sugar

## 2024-11-11 NOTE — Assessment & Plan Note (Signed)
 Suboptimal. Has not increased amlodipine  as previously recommended. Target blood pressure below 130/80 mmHg. Titrate up amlodipine  to 10 mg once daily. Continue lisinopril 20 mg once daily.

## 2024-11-14 ENCOUNTER — Inpatient Hospital Stay: Admission: RE | Admit: 2024-11-14 | Discharge: 2024-11-14 | Attending: Student

## 2024-11-14 ENCOUNTER — Other Ambulatory Visit: Payer: Self-pay | Admitting: Nurse Practitioner

## 2024-11-14 DIAGNOSIS — Z1231 Encounter for screening mammogram for malignant neoplasm of breast: Secondary | ICD-10-CM

## 2024-11-14 DIAGNOSIS — N959 Unspecified menopausal and perimenopausal disorder: Secondary | ICD-10-CM

## 2024-11-19 NOTE — Telephone Encounter (Signed)
 Left vm to return call.

## 2024-11-19 NOTE — Telephone Encounter (Signed)
 Pt returned call to f/u please advise

## 2024-11-19 NOTE — Telephone Encounter (Signed)
-----   Message from Alean Kobus, MD sent at 10/29/2024  4:25 PM EST ----- Please inform her heart monitor results showed no major abnormalities. No significant findings to explain her symptoms. At her last office visit we did discuss an order cardiac PET stress test.  I do not see this being scheduled or completed yet. Please find out the status of the study from her and if there are any new or ongoing symptoms. I see that she has a follow-up coming up on December 2, this can be moved further out if no active concerns until the cardiac PET stress test is completed. Thank you ----- Message ----- From: Kobus Alean SAUNDERS, MD Sent: 10/29/2024   4:22 PM EST To: Alean SAUNDERS Madireddy, MD

## 2024-11-21 ENCOUNTER — Other Ambulatory Visit: Payer: Self-pay | Admitting: Nurse Practitioner

## 2024-11-21 DIAGNOSIS — R928 Other abnormal and inconclusive findings on diagnostic imaging of breast: Secondary | ICD-10-CM

## 2024-11-24 ENCOUNTER — Other Ambulatory Visit (HOSPITAL_BASED_OUTPATIENT_CLINIC_OR_DEPARTMENT_OTHER)

## 2024-12-05 ENCOUNTER — Other Ambulatory Visit: Payer: Self-pay | Admitting: Nurse Practitioner

## 2024-12-05 ENCOUNTER — Ambulatory Visit
Admission: RE | Admit: 2024-12-05 | Discharge: 2024-12-05 | Disposition: A | Discharge status: Home / Self Care | Source: Ambulatory Visit | Attending: Nurse Practitioner | Admitting: Nurse Practitioner

## 2024-12-05 DIAGNOSIS — R928 Other abnormal and inconclusive findings on diagnostic imaging of breast: Secondary | ICD-10-CM

## 2024-12-05 DIAGNOSIS — R921 Mammographic calcification found on diagnostic imaging of breast: Secondary | ICD-10-CM

## 2024-12-11 ENCOUNTER — Ambulatory Visit
Admission: RE | Admit: 2024-12-11 | Discharge: 2024-12-11 | Disposition: A | Discharge status: Home / Self Care | Source: Ambulatory Visit | Attending: Nurse Practitioner | Admitting: Nurse Practitioner

## 2024-12-11 DIAGNOSIS — R921 Mammographic calcification found on diagnostic imaging of breast: Secondary | ICD-10-CM

## 2024-12-11 DIAGNOSIS — R928 Other abnormal and inconclusive findings on diagnostic imaging of breast: Secondary | ICD-10-CM

## 2024-12-11 HISTORY — PX: BREAST BIOPSY: SHX20

## 2024-12-12 LAB — SURGICAL PATHOLOGY

## 2024-12-15 NOTE — Progress Notes (Signed)
 Chelsea Kerr                                          MRN: [id number]   12/15/2024   The VBCI Quality Team Specialist reviewed this patient medical record for the purposes of chart review for care gap closure. The following were reviewed: chart review for care gap closure-glycemic status assessment.    VBCI Quality Team

## 2024-12-16 ENCOUNTER — Telehealth (HOSPITAL_COMMUNITY): Payer: Self-pay | Admitting: Emergency Medicine

## 2024-12-16 NOTE — Telephone Encounter (Signed)
 Unable to leave vm Rockwell Alexandria RN Navigator Cardiac Imaging [facility] V[facility] Services [phone] Office  [phone] Cell

## 2024-12-17 ENCOUNTER — Encounter (HOSPITAL_COMMUNITY): Admission: RE | Admit: 2024-12-17 | Source: Ambulatory Visit

## 2025-01-14 ENCOUNTER — Other Ambulatory Visit (HOSPITAL_COMMUNITY)

## 2025-02-09 ENCOUNTER — Ambulatory Visit: Payer: Self-pay

## 2025-02-16 ENCOUNTER — Ambulatory Visit (HOSPITAL_BASED_OUTPATIENT_CLINIC_OR_DEPARTMENT_OTHER)
